# Patient Record
Sex: Female | Born: 1985 | Race: White | Hispanic: No | Marital: Single | Smoking: Never smoker
Health system: Southern US, Community
[De-identification: ages and names within clinical notes are randomized; demographics above are authoritative.]

## PROBLEM LIST (undated history)

## (undated) DIAGNOSIS — G43909 Migraine, unspecified, not intractable, without status migrainosus: Secondary | ICD-10-CM

## (undated) HISTORY — PX: CHOLECYSTECTOMY: SHX55

## (undated) HISTORY — PX: LAPAROSCOPIC OOPHERECTOMY: SHX6507

## (undated) HISTORY — PX: ABDOMINAL SURGERY: SHX537

---
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
## 2007-05-17 ENCOUNTER — Emergency Department: Payer: Self-pay | Admitting: Internal Medicine

## 2007-09-19 ENCOUNTER — Observation Stay: Payer: Self-pay | Admitting: Obstetrics and Gynecology

## 2008-03-03 ENCOUNTER — Emergency Department: Payer: Self-pay | Admitting: Emergency Medicine

## 2009-11-14 ENCOUNTER — Emergency Department: Payer: Self-pay | Admitting: Unknown Physician Specialty

## 2010-09-10 ENCOUNTER — Emergency Department: Payer: Self-pay | Admitting: Emergency Medicine

## 2011-01-25 ENCOUNTER — Emergency Department: Payer: Self-pay | Admitting: Emergency Medicine

## 2012-04-23 LAB — CBC
HCT: 44.9 % (ref 35.0–47.0)
HGB: 15 g/dL (ref 12.0–16.0)
MCH: 29.5 pg (ref 26.0–34.0)
MCHC: 33.5 g/dL (ref 32.0–36.0)
MCV: 88 fL (ref 80–100)
Platelet: 225 10*3/uL (ref 150–440)
RBC: 5.1 10*6/uL (ref 3.80–5.20)
RDW: 13 % (ref 11.5–14.5)
WBC: 17.1 10*3/uL — ABNORMAL HIGH (ref 3.6–11.0)

## 2012-04-23 LAB — COMPREHENSIVE METABOLIC PANEL
Albumin: 4.5 g/dL (ref 3.4–5.0)
Alkaline Phosphatase: 90 U/L (ref 50–136)
Anion Gap: 11 (ref 7–16)
BUN: 11 mg/dL (ref 7–18)
Bilirubin,Total: 1 mg/dL (ref 0.2–1.0)
Calcium, Total: 9.9 mg/dL (ref 8.5–10.1)
Chloride: 102 mmol/L (ref 98–107)
Co2: 23 mmol/L (ref 21–32)
Creatinine: 0.63 mg/dL (ref 0.60–1.30)
EGFR (African American): >60
EGFR (Non-African Amer.): >60
Glucose: 97 mg/dL (ref 65–99)
Osmolality: 271 (ref 275–301)
Potassium: 4.2 mmol/L (ref 3.5–5.1)
SGOT(AST): 14 U/L — ABNORMAL LOW (ref 15–37)
SGPT (ALT): 20 U/L
Sodium: 136 mmol/L (ref 136–145)
Total Protein: 8.8 g/dL — ABNORMAL HIGH (ref 6.4–8.2)

## 2012-04-23 LAB — LIPASE, BLOOD: Lipase: 92 U/L (ref 73–393)

## 2012-04-24 ENCOUNTER — Inpatient Hospital Stay: Payer: Self-pay | Admitting: Obstetrics and Gynecology

## 2012-04-24 LAB — URINALYSIS, COMPLETE
Bilirubin,UR: NEGATIVE
Glucose,UR: NEGATIVE mg/dL (ref 0–75)
Leukocyte Esterase: NEGATIVE
Nitrite: NEGATIVE
Ph: 6 (ref 4.5–8.0)
Protein: 30
RBC,UR: 4 /HPF (ref 0–5)
Specific Gravity: 1.03 (ref 1.003–1.030)
Squamous Epithelial: 2
WBC UR: 5 /HPF (ref 0–5)

## 2012-04-24 LAB — PREGNANCY, URINE: Pregnancy Test, Urine: NEGATIVE m[IU]/mL

## 2012-04-25 LAB — CBC WITH DIFFERENTIAL/PLATELET
Basophil #: 0 10*3/uL (ref 0.0–0.1)
Basophil %: 0.4 %
Eosinophil #: 0.1 10*3/uL (ref 0.0–0.7)
Eosinophil %: 0.8 %
HCT: 31.4 % — ABNORMAL LOW (ref 35.0–47.0)
HGB: 10.4 g/dL — ABNORMAL LOW (ref 12.0–16.0)
Lymphocyte #: 1.9 10*3/uL (ref 1.0–3.6)
Lymphocyte %: 18.9 %
MCH: 29.6 pg (ref 26.0–34.0)
MCHC: 33.3 g/dL (ref 32.0–36.0)
MCV: 89 fL (ref 80–100)
Monocyte #: 0.9 x10 3/mm (ref 0.2–0.9)
Monocyte %: 9.2 %
Neutrophil #: 7 10*3/uL — ABNORMAL HIGH (ref 1.4–6.5)
Neutrophil %: 70.7 %
Platelet: 152 10*3/uL (ref 150–440)
RBC: 3.52 10*6/uL — ABNORMAL LOW (ref 3.80–5.20)
RDW: 13.2 % (ref 11.5–14.5)
WBC: 9.9 10*3/uL (ref 3.6–11.0)

## 2012-04-25 LAB — CREATININE, SERUM
Creatinine: 0.54 mg/dL — ABNORMAL LOW (ref 0.60–1.30)
EGFR (African American): >60
EGFR (Non-African Amer.): >60

## 2012-04-26 LAB — CBC WITH DIFFERENTIAL/PLATELET
Basophil #: 0 10*3/uL (ref 0.0–0.1)
Basophil %: 0.3 %
Eosinophil #: 0.1 10*3/uL (ref 0.0–0.7)
Eosinophil %: 1.8 %
HCT: 31.3 % — ABNORMAL LOW (ref 35.0–47.0)
HGB: 10.4 g/dL — ABNORMAL LOW (ref 12.0–16.0)
Lymphocyte #: 1.9 10*3/uL (ref 1.0–3.6)
Lymphocyte %: 27.6 %
MCH: 29.9 pg (ref 26.0–34.0)
MCHC: 33.2 g/dL (ref 32.0–36.0)
MCV: 90 fL (ref 80–100)
Monocyte #: 0.5 x10 3/mm (ref 0.2–0.9)
Monocyte %: 7.3 %
Neutrophil #: 4.4 10*3/uL (ref 1.4–6.5)
Neutrophil %: 63 %
Platelet: 162 10*3/uL (ref 150–440)
RBC: 3.48 10*6/uL — ABNORMAL LOW (ref 3.80–5.20)
RDW: 13.1 % (ref 11.5–14.5)
WBC: 6.9 10*3/uL (ref 3.6–11.0)

## 2012-04-26 LAB — PATHOLOGY REPORT

## 2012-07-24 ENCOUNTER — Inpatient Hospital Stay: Payer: Self-pay | Admitting: Surgery

## 2012-07-24 LAB — BASIC METABOLIC PANEL
Anion Gap: 12 (ref 7–16)
BUN: 14 mg/dL (ref 7–18)
Calcium, Total: 9.3 mg/dL (ref 8.5–10.1)
Chloride: 104 mmol/L (ref 98–107)
Co2: 19 mmol/L — ABNORMAL LOW (ref 21–32)
Creatinine: 0.59 mg/dL — ABNORMAL LOW (ref 0.60–1.30)
EGFR (African American): >60
EGFR (Non-African Amer.): >60
Glucose: 81 mg/dL (ref 65–99)
Osmolality: 270 (ref 275–301)
Potassium: 3.8 mmol/L (ref 3.5–5.1)
Sodium: 135 mmol/L — ABNORMAL LOW (ref 136–145)

## 2012-07-24 LAB — CBC WITH DIFFERENTIAL/PLATELET
Basophil #: 0 10*3/uL (ref 0.0–0.1)
Basophil %: 0.2 %
Eosinophil #: 0 10*3/uL (ref 0.0–0.7)
Eosinophil %: 0.1 %
HCT: 38.9 % (ref 35.0–47.0)
HGB: 13.3 g/dL (ref 12.0–16.0)
Lymphocyte #: 0.9 10*3/uL — ABNORMAL LOW (ref 1.0–3.6)
Lymphocyte %: 8.7 %
MCH: 29.7 pg (ref 26.0–34.0)
MCHC: 34.3 g/dL (ref 32.0–36.0)
MCV: 87 fL (ref 80–100)
Monocyte #: 1 x10 3/mm — ABNORMAL HIGH (ref 0.2–0.9)
Monocyte %: 9.5 %
Neutrophil #: 8.4 10*3/uL — ABNORMAL HIGH (ref 1.4–6.5)
Neutrophil %: 81.5 %
Platelet: 210 10*3/uL (ref 150–440)
RBC: 4.5 10*6/uL (ref 3.80–5.20)
RDW: 12.8 % (ref 11.5–14.5)
WBC: 10.3 10*3/uL (ref 3.6–11.0)

## 2012-07-24 LAB — URINALYSIS, COMPLETE
Blood: NEGATIVE
Glucose,UR: NEGATIVE mg/dL (ref 0–75)
Leukocyte Esterase: NEGATIVE
Nitrite: POSITIVE
Ph: 6 (ref 4.5–8.0)
Protein: 30
RBC,UR: 1 /HPF (ref 0–5)
Specific Gravity: 1.026 (ref 1.003–1.030)
Squamous Epithelial: 5
WBC UR: NONE SEEN /HPF (ref 0–5)

## 2012-07-24 LAB — HEPATIC FUNCTION PANEL A (ARMC)
Albumin: 4.2 g/dL (ref 3.4–5.0)
Alkaline Phosphatase: 281 U/L — ABNORMAL HIGH (ref 50–136)
Bilirubin, Direct: 3.3 mg/dL — ABNORMAL HIGH (ref 0.00–0.20)
Bilirubin,Total: 4.7 mg/dL — ABNORMAL HIGH (ref 0.2–1.0)
SGOT(AST): 254 U/L — ABNORMAL HIGH (ref 15–37)
SGPT (ALT): 570 U/L — ABNORMAL HIGH (ref 12–78)
Total Protein: 7.5 g/dL (ref 6.4–8.2)

## 2012-07-24 LAB — LIPASE, BLOOD: Lipase: >3000 U/L (ref 73–393)

## 2012-07-25 LAB — CBC WITH DIFFERENTIAL/PLATELET
Basophil #: 0 10*3/uL (ref 0.0–0.1)
Basophil %: 0.2 %
Eosinophil #: 0 10*3/uL (ref 0.0–0.7)
Eosinophil %: 0 %
HCT: 42 % (ref 35.0–47.0)
HGB: 13.7 g/dL (ref 12.0–16.0)
Lymphocyte #: 0.6 10*3/uL — ABNORMAL LOW (ref 1.0–3.6)
Lymphocyte %: 5.3 %
MCH: 28.9 pg (ref 26.0–34.0)
MCHC: 32.5 g/dL (ref 32.0–36.0)
MCV: 89 fL (ref 80–100)
Monocyte #: 0.5 x10 3/mm (ref 0.2–0.9)
Monocyte %: 4.5 %
Neutrophil #: 10 10*3/uL — ABNORMAL HIGH (ref 1.4–6.5)
Neutrophil %: 90 %
Platelet: 204 10*3/uL (ref 150–440)
RBC: 4.73 10*6/uL (ref 3.80–5.20)
RDW: 13.2 % (ref 11.5–14.5)
WBC: 11.1 10*3/uL — ABNORMAL HIGH (ref 3.6–11.0)

## 2012-07-25 LAB — COMPREHENSIVE METABOLIC PANEL
Albumin: 4 g/dL (ref 3.4–5.0)
Alkaline Phosphatase: 283 U/L — ABNORMAL HIGH (ref 50–136)
Anion Gap: 11 (ref 7–16)
BUN: 7 mg/dL (ref 7–18)
Bilirubin,Total: 2.8 mg/dL — ABNORMAL HIGH (ref 0.2–1.0)
Calcium, Total: 9.1 mg/dL (ref 8.5–10.1)
Chloride: 105 mmol/L (ref 98–107)
Co2: 18 mmol/L — ABNORMAL LOW (ref 21–32)
Creatinine: 0.54 mg/dL — ABNORMAL LOW (ref 0.60–1.30)
EGFR (African American): >60
EGFR (Non-African Amer.): >60
Glucose: 69 mg/dL (ref 65–99)
Osmolality: 265 (ref 275–301)
Potassium: 4.3 mmol/L (ref 3.5–5.1)
SGOT(AST): 145 U/L — ABNORMAL HIGH (ref 15–37)
SGPT (ALT): 457 U/L — ABNORMAL HIGH (ref 12–78)
Sodium: 134 mmol/L — ABNORMAL LOW (ref 136–145)
Total Protein: 8 g/dL (ref 6.4–8.2)

## 2012-07-25 LAB — LIPASE, BLOOD: Lipase: 2102 U/L — ABNORMAL HIGH (ref 73–393)

## 2012-07-26 LAB — CBC WITH DIFFERENTIAL/PLATELET
Basophil #: 0 10*3/uL (ref 0.0–0.1)
Basophil %: 0.3 %
Eosinophil #: 0 10*3/uL (ref 0.0–0.7)
Eosinophil %: 0.2 %
HCT: 39.6 % (ref 35.0–47.0)
HGB: 13.5 g/dL (ref 12.0–16.0)
Lymphocyte #: 1 10*3/uL (ref 1.0–3.6)
Lymphocyte %: 13.6 %
MCH: 29.7 pg (ref 26.0–34.0)
MCHC: 34.1 g/dL (ref 32.0–36.0)
MCV: 87 fL (ref 80–100)
Monocyte #: 0.7 x10 3/mm (ref 0.2–0.9)
Monocyte %: 9.7 %
Neutrophil #: 5.3 10*3/uL (ref 1.4–6.5)
Neutrophil %: 76.2 %
Platelet: 199 10*3/uL (ref 150–440)
RBC: 4.55 10*6/uL (ref 3.80–5.20)
RDW: 13.2 % (ref 11.5–14.5)
WBC: 7 10*3/uL (ref 3.6–11.0)

## 2012-07-26 LAB — LIPASE, BLOOD: Lipase: 159 U/L (ref 73–393)

## 2012-07-26 LAB — COMPREHENSIVE METABOLIC PANEL
Albumin: 3.8 g/dL (ref 3.4–5.0)
Alkaline Phosphatase: 258 U/L — ABNORMAL HIGH (ref 50–136)
Anion Gap: 11 (ref 7–16)
BUN: 7 mg/dL (ref 7–18)
Bilirubin,Total: 1.9 mg/dL — ABNORMAL HIGH (ref 0.2–1.0)
Calcium, Total: 9.2 mg/dL (ref 8.5–10.1)
Chloride: 107 mmol/L (ref 98–107)
Co2: 16 mmol/L — ABNORMAL LOW (ref 21–32)
Creatinine: 0.71 mg/dL (ref 0.60–1.30)
EGFR (African American): >60
EGFR (Non-African Amer.): >60
Glucose: 65 mg/dL (ref 65–99)
Osmolality: 264 (ref 275–301)
Potassium: 4.2 mmol/L (ref 3.5–5.1)
SGOT(AST): 80 U/L — ABNORMAL HIGH (ref 15–37)
SGPT (ALT): 334 U/L — ABNORMAL HIGH (ref 12–78)
Sodium: 134 mmol/L — ABNORMAL LOW (ref 136–145)
Total Protein: 7.9 g/dL (ref 6.4–8.2)

## 2012-07-27 LAB — CBC WITH DIFFERENTIAL/PLATELET
Basophil #: 0 10*3/uL (ref 0.0–0.1)
Basophil %: 0.4 %
Eosinophil #: 0 10*3/uL (ref 0.0–0.7)
Eosinophil %: 0.8 %
HCT: 37.7 % (ref 35.0–47.0)
HGB: 12.8 g/dL (ref 12.0–16.0)
Lymphocyte #: 1.9 10*3/uL (ref 1.0–3.6)
Lymphocyte %: 35.8 %
MCH: 29.5 pg (ref 26.0–34.0)
MCHC: 33.9 g/dL (ref 32.0–36.0)
MCV: 87 fL (ref 80–100)
Monocyte #: 0.7 x10 3/mm (ref 0.2–0.9)
Monocyte %: 14.1 %
Neutrophil #: 2.6 10*3/uL (ref 1.4–6.5)
Neutrophil %: 48.9 %
Platelet: 201 10*3/uL (ref 150–440)
RBC: 4.33 10*6/uL (ref 3.80–5.20)
RDW: 13.2 % (ref 11.5–14.5)
WBC: 5.3 10*3/uL (ref 3.6–11.0)

## 2012-07-27 LAB — COMPREHENSIVE METABOLIC PANEL
Albumin: 3.4 g/dL (ref 3.4–5.0)
Alkaline Phosphatase: 227 U/L — ABNORMAL HIGH (ref 50–136)
Anion Gap: 10 (ref 7–16)
BUN: 5 mg/dL — ABNORMAL LOW (ref 7–18)
Bilirubin,Total: 1.6 mg/dL — ABNORMAL HIGH (ref 0.2–1.0)
Calcium, Total: 9 mg/dL (ref 8.5–10.1)
Chloride: 107 mmol/L (ref 98–107)
Co2: 22 mmol/L (ref 21–32)
Creatinine: 0.78 mg/dL (ref 0.60–1.30)
EGFR (African American): >60
EGFR (Non-African Amer.): >60
Glucose: 80 mg/dL (ref 65–99)
Osmolality: 274 (ref 275–301)
Potassium: 3.8 mmol/L (ref 3.5–5.1)
SGOT(AST): 74 U/L — ABNORMAL HIGH (ref 15–37)
SGPT (ALT): 254 U/L — ABNORMAL HIGH (ref 12–78)
Sodium: 139 mmol/L (ref 136–145)
Total Protein: 7.3 g/dL (ref 6.4–8.2)

## 2012-07-27 LAB — LIPASE, BLOOD: Lipase: 202 U/L (ref 73–393)

## 2012-07-28 LAB — COMPREHENSIVE METABOLIC PANEL
Albumin: 3.2 g/dL — ABNORMAL LOW (ref 3.4–5.0)
Alkaline Phosphatase: 189 U/L — ABNORMAL HIGH (ref 50–136)
Anion Gap: 9 (ref 7–16)
BUN: 3 mg/dL — ABNORMAL LOW (ref 7–18)
Bilirubin,Total: 1.2 mg/dL — ABNORMAL HIGH (ref 0.2–1.0)
Calcium, Total: 8.7 mg/dL (ref 8.5–10.1)
Chloride: 104 mmol/L (ref 98–107)
Co2: 26 mmol/L (ref 21–32)
Creatinine: 0.63 mg/dL (ref 0.60–1.30)
EGFR (African American): >60
EGFR (Non-African Amer.): >60
Glucose: 139 mg/dL — ABNORMAL HIGH (ref 65–99)
Osmolality: 276 (ref 275–301)
Potassium: 3.3 mmol/L — ABNORMAL LOW (ref 3.5–5.1)
SGOT(AST): 67 U/L — ABNORMAL HIGH (ref 15–37)
SGPT (ALT): 203 U/L — ABNORMAL HIGH (ref 12–78)
Sodium: 139 mmol/L (ref 136–145)
Total Protein: 6.8 g/dL (ref 6.4–8.2)

## 2012-07-28 LAB — CBC WITH DIFFERENTIAL/PLATELET
Basophil #: 0 10*3/uL (ref 0.0–0.1)
Basophil %: 0.3 %
Eosinophil #: 0 10*3/uL (ref 0.0–0.7)
Eosinophil %: 0.2 %
HCT: 34.8 % — ABNORMAL LOW (ref 35.0–47.0)
HGB: 12.1 g/dL (ref 12.0–16.0)
Lymphocyte #: 1.6 10*3/uL (ref 1.0–3.6)
Lymphocyte %: 18.7 %
MCH: 30 pg (ref 26.0–34.0)
MCHC: 34.8 g/dL (ref 32.0–36.0)
MCV: 86 fL (ref 80–100)
Monocyte #: 1 x10 3/mm — ABNORMAL HIGH (ref 0.2–0.9)
Monocyte %: 11.3 %
Neutrophil #: 5.9 10*3/uL (ref 1.4–6.5)
Neutrophil %: 69.5 %
Platelet: 187 10*3/uL (ref 150–440)
RBC: 4.05 10*6/uL (ref 3.80–5.20)
RDW: 13.5 % (ref 11.5–14.5)
WBC: 8.5 10*3/uL (ref 3.6–11.0)

## 2012-07-28 LAB — LIPASE, BLOOD: Lipase: 133 U/L (ref 73–393)

## 2012-07-31 LAB — PATHOLOGY REPORT

## 2012-11-19 ENCOUNTER — Emergency Department: Payer: Self-pay | Admitting: Emergency Medicine

## 2012-11-19 LAB — CBC
HCT: 41.7 % (ref 35.0–47.0)
HGB: 14 g/dL (ref 12.0–16.0)
MCH: 28.9 pg (ref 26.0–34.0)
MCHC: 33.5 g/dL (ref 32.0–36.0)
MCV: 86 fL (ref 80–100)
Platelet: 201 10*3/uL (ref 150–440)
RBC: 4.83 10*6/uL (ref 3.80–5.20)
RDW: 12.8 % (ref 11.5–14.5)
WBC: 8.4 10*3/uL (ref 3.6–11.0)

## 2012-11-19 LAB — COMPREHENSIVE METABOLIC PANEL
Albumin: 4.5 g/dL (ref 3.4–5.0)
Alkaline Phosphatase: 119 U/L (ref 50–136)
Anion Gap: 6 — ABNORMAL LOW (ref 7–16)
BUN: 8 mg/dL (ref 7–18)
Bilirubin,Total: 0.8 mg/dL (ref 0.2–1.0)
Calcium, Total: 9.4 mg/dL (ref 8.5–10.1)
Chloride: 105 mmol/L (ref 98–107)
Co2: 26 mmol/L (ref 21–32)
Creatinine: 0.76 mg/dL (ref 0.60–1.30)
EGFR (African American): >60
EGFR (Non-African Amer.): >60
Glucose: 97 mg/dL (ref 65–99)
Osmolality: 272 (ref 275–301)
Potassium: 3.6 mmol/L (ref 3.5–5.1)
SGOT(AST): 20 U/L (ref 15–37)
SGPT (ALT): 16 U/L (ref 12–78)
Sodium: 137 mmol/L (ref 136–145)
Total Protein: 8.6 g/dL — ABNORMAL HIGH (ref 6.4–8.2)

## 2012-11-19 LAB — URINALYSIS, COMPLETE
Bilirubin,UR: NEGATIVE
Blood: NEGATIVE
Glucose,UR: NEGATIVE mg/dL (ref 0–75)
Nitrite: NEGATIVE
Ph: 5 (ref 4.5–8.0)
Protein: NEGATIVE
RBC,UR: 4 /HPF (ref 0–5)
Specific Gravity: 1.025 (ref 1.003–1.030)
Squamous Epithelial: 16
WBC UR: 11 /HPF (ref 0–5)

## 2012-11-19 LAB — LIPASE, BLOOD: Lipase: 79 U/L (ref 73–393)

## 2012-11-19 LAB — PREGNANCY, URINE: Pregnancy Test, Urine: NEGATIVE m[IU]/mL

## 2013-07-17 ENCOUNTER — Emergency Department: Payer: Self-pay | Admitting: Emergency Medicine

## 2013-08-08 ENCOUNTER — Emergency Department: Payer: Self-pay | Admitting: Emergency Medicine

## 2013-08-08 LAB — COMPREHENSIVE METABOLIC PANEL
Albumin: 4 g/dL (ref 3.4–5.0)
Alkaline Phosphatase: 93 U/L (ref 50–136)
Anion Gap: 11 (ref 7–16)
BUN: 5 mg/dL — ABNORMAL LOW (ref 7–18)
Bilirubin,Total: 0.7 mg/dL (ref 0.2–1.0)
Calcium, Total: 9.4 mg/dL (ref 8.5–10.1)
Chloride: 103 mmol/L (ref 98–107)
Co2: 23 mmol/L (ref 21–32)
Creatinine: 0.78 mg/dL (ref 0.60–1.30)
EGFR (African American): >60
EGFR (Non-African Amer.): >60
Glucose: 105 mg/dL — ABNORMAL HIGH (ref 65–99)
Osmolality: 271 (ref 275–301)
Potassium: 3.6 mmol/L (ref 3.5–5.1)
SGOT(AST): 15 U/L (ref 15–37)
SGPT (ALT): 15 U/L (ref 12–78)
Sodium: 137 mmol/L (ref 136–145)
Total Protein: 7.5 g/dL (ref 6.4–8.2)

## 2013-08-08 LAB — URINALYSIS, COMPLETE
Bilirubin,UR: NEGATIVE
Blood: NEGATIVE
Glucose,UR: NEGATIVE mg/dL (ref 0–75)
Ketone: NEGATIVE
Leukocyte Esterase: NEGATIVE
Nitrite: POSITIVE
Ph: 6 (ref 4.5–8.0)
Protein: NEGATIVE
RBC,UR: 1 /HPF (ref 0–5)
Specific Gravity: 1.017 (ref 1.003–1.030)
Squamous Epithelial: 11
WBC UR: 2 /HPF (ref 0–5)

## 2013-08-08 LAB — CBC
HCT: 40.3 % (ref 35.0–47.0)
HGB: 13.8 g/dL (ref 12.0–16.0)
MCH: 29.2 pg (ref 26.0–34.0)
MCHC: 34.3 g/dL (ref 32.0–36.0)
MCV: 85 fL (ref 80–100)
Platelet: 235 10*3/uL (ref 150–440)
RBC: 4.73 10*6/uL (ref 3.80–5.20)
RDW: 12.9 % (ref 11.5–14.5)
WBC: 7.6 10*3/uL (ref 3.6–11.0)

## 2013-08-08 LAB — HCG, QUANTITATIVE, PREGNANCY: Beta Hcg, Quant.: 10389 m[IU]/mL — ABNORMAL HIGH

## 2013-08-10 LAB — URINE CULTURE

## 2013-12-08 ENCOUNTER — Ambulatory Visit: Payer: Self-pay | Admitting: Physician Assistant

## 2013-12-08 LAB — URINALYSIS, COMPLETE
Bilirubin,UR: NEGATIVE
Blood: NEGATIVE
Glucose,UR: NEGATIVE mg/dL (ref 0–75)
Nitrite: NEGATIVE
Ph: 7 (ref 4.5–8.0)
Protein: 30
Specific Gravity: 1.02 (ref 1.003–1.030)

## 2013-12-08 LAB — RAPID STREP-A WITH REFLX: Micro Text Report: NEGATIVE

## 2013-12-11 LAB — BETA STREP CULTURE(ARMC)

## 2015-03-09 NOTE — Consult Note (Signed)
PATIENT NAME:  Jeanne Pena, Jahara R MR#:  [id number] DATE OF BIRTH:  [birth year]  DATE OF CONSULTATION:  07/25/2012  REFERRING PHYSICIAN:   CONSULTING PHYSICIAN:  Rodman Keyawn S. Jerrad Mendibles, NP/Paul Oh, MD PRIMARY CARE PHYSICIAN: None. ATTENDING: Marshia Lyandy Ely, MD  REASON FOR CONSULTATION: Acute pancreatitis.   HISTORY OF PRESENT ILLNESS: Jeanne Pena is a 29 year old Caucasian female who presented to [facility] emergency room with a 2-day history of significant abdominal pain. Most of the history was obtained from her mother as patient has been medicated with pain medicine. Monday of this week mother states that her daughter was to meet her at a cookout. She was late.  She spoke to her daughter. Her daughter stated she had an excess amount of indigestion with pain radiating through to her back. She needed to lie down for 45 minutes, was able to continue to her destination. After she got there she ate very little, vomited 5 to 6 times that day. Tuesday she felt better, and then symptoms reoccurred with pain midchest radiating through to her back between her shoulder blades. Later that evening she started vomiting again. She presented to the emergency room for the concern of reoccurrence of these episodes. Significant other is present who states that he has had these episodes but on a much milder scale, average once every couple of months. She is status post laparotomy for torsion of ovary left side June 2013. She has lost approximately 3 to 4 pounds since that time. Has remained afebrile, no reflux, but significant again for indigestion. She had remained very nauseated until presenting to the emergency room. No rectal bleeding. No change in bowel habits. No diarrhea. No melena. Does take Advil or Aleve on a regular basis for headache. No new supplements, especially over the counter. No new medication therapy by prescription.   HOME MEDICATIONS:  Advil or Aleve as directed as needed. Sleep aid over the counter as directed as  needed. [employer] brand sinus pills.   PAST MEDICAL HISTORY: Migraine headaches.   PAST SURGICAL HISTORY: C-section. Laparotomy for torsion of ovary June 2013.   FAMILY HISTORY: Grandmother maternal, cervical cancer diagnosed her late 5050s. No family history of colon cancer, pancreatic concerns.   SOCIAL HISTORY: No tobacco use or alcohol. No recreational drug use. 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 Single, 1 child.   ALLERGIES: None.   REVIEW OF SYSTEMS: All 10 systems reviewed and checked through patient as well as her mother and is unremarkable other than what is stated above.   PHYSICAL EXAMINATION:  VITAL SIGNS: Temperature is 97.8, pulse is 69, respirations are 18, blood pressure 95/55 with a pulse oximetry of 97% on room air.   GENERAL: Well developed, well nourished, 29 year old Caucasian female, resting very comfortably, just recently had received pain medication. Family members at bedside as previously stated.     HEENT: Normocephalic, atraumatic. Pupils equal, reactive to light. Conjunctiva clear. Sclerae anicteric.   NECK: Supple. Trachea midline. No lymphadenopathy or thyromegaly.   PULMONARY: Symmetric rise and fall of chest. Clear to auscultation throughout.   CARDIOVASCULAR: Regular rhythm, S1, S2. No murmurs, no gallops.   ABDOMEN: Soft, nondistended. Bowel sounds are hypoactive. No bruits. No masses. Nontender.   RECTAL: Deferred.   MUSCULOSKELETAL: No contractures. No clubbing,   EXTREMITIES: No edema.   PSYCH: Alert, oriented, but groggy.   NEUROLOGICAL: No gross neurological deficits.   LABORATORY/DIAGNOSTICS: Chemistry panel on admission: Creatinine was 0.59, sodium 135, CO2 was 19, otherwise within normal limits. Lipase greater than  3000. Comparison today's date, creatinine 0.54, sodium declined to 134, CO2 of 18, and lipase has improved to 2102. Hepatic panel: Total bilirubin on admission was 4.7, direct was 3.3 alkaline phosphatase was 281, AST is 254 with  an ALT of 570. Today total bilirubin is improved to 2.8, alkaline phosphatase is 283, AST is 145, and ALT is 457. CBC was within normal limits on admission.  Specifically, WBC was 10.3 which has risen today to 11.1. Neutrophil number was consistently elevated at a range of 8.4 to 10. 0.  Lymphocyte number was 0.9 to 0.6 and monocyte number was 1.0 and improved to within normal range at 0.5. Urinalysis revealed +1 bilirubin, ketones +2, specific gravity 1.02, protein 30 mg/dL, nitrates are positive, bacteria is +2.   EKG: Normal sinus rhythm. Chest, PA and lateral: No acute disease of the chest. Abdominal ultrasound revealed gallbladder adequately distended, contains multiple mobile stones as well as sludge, positive sonographic Murphy's sign, mild gallbladder wall thickening at 3.4 mm. Common bile duct dilated at 10.1 mm. There was no pericholecystic fluid. The pancreatic head exhibits no suspicious masses, but there is increased echogenicity within the pancreas, may reflect fatty infiltration changes.   IMPRESSION: Admitted for gallstone pancreatitis. Abdominal ultrasound revealed evidence of multiple gallstones, probable acute cholecystitis with positive sonographic Murphy's sign, mild dilatation of common bile duct at 10.1 mm. Elevation in LFTs which are trending downward inclusive of lipase.   PLAN: The patient's presentation will be discussed with Dr. Renae Fickle. Recommendations will follow in addendum form.   These services provided by Owens Shark, MS, APRN, [facility], FNP under collaborative agreement with Lutricia Feil, MD.     ____________________________ Rodman Key, NP dsh:vtd D: 07/25/2012 16:29:59 ET T: 07/26/2012 09:27:00 ET JOB#: [id number]  cc: Rodman Key, NP, <Dictator> Rodman Key MD ELECTRONICALLY SIGNED 07/30/2012 14:35

## 2015-03-09 NOTE — Consult Note (Signed)
Pt seen and examined. See Dawn Harrison's notes. Pt with gallstone pancreatitis. Pancreatitis improving. Prefer to wait few days to allow pancreatitis to resolve before proceeding with ERCP. However, if pancreatitis does not resolve quickly, then ERCP sooner than later. Will follow. Thanks.  Electronic Signatures: Lutricia Feilh, Adiel Erney (MD)  (Signed on 05-Sep-13 21:20)  Authored  Last Updated: 05-Sep-13 21:20 by Lutricia Feilh, Obrian Bulson (MD)

## 2015-03-09 NOTE — Consult Note (Signed)
Brief Consult Note: Diagnosis: Admitted for gallstone pancreatitis.  Abdominal ultrasound revealed evidence of multiple gallstones, probable acute cholecystitis with a positive Murphy's sign and dilatation of CBD.  Elevation of LFT.   Consult note dictated.   Discussed with Attending MD.   Comments: Patient's presentation will be discussed with Dr. Lutricia FeilPaul Oh.  Addendum to follow.  Spoke with Dr. Bluford Kaufmannh and will proceed with ERCP when clinically feasible.  LFT and lipase are trending downward.  Recommend continue monitoring.  NPO status.  Continue to monitor laboratory studies..  Electronic Signatures: Rodman KeyHarrison, Dawn S (NP)  (Signed 05-Sep-13 17:11)  Authored: Brief Consult Note   Last Updated: 05-Sep-13 17:11 by Rodman KeyHarrison, Dawn S (NP)

## 2015-03-09 NOTE — Consult Note (Signed)
Chief Complaint:   Subjective/Chief Complaint Feels much better this AM. Min abd pain. Lipase back to normal.LFT improving but still abnormal.   VITAL SIGNS/ANCILLARY NOTES: **Vital Signs.:   06-Sep-13 05:09   Vital Signs Type Routine   Temperature Temperature (F) 98.1   Celsius 36.7   Temperature Source Oral   Pulse Pulse 68   Respirations Respirations 18   Systolic BP Systolic BP 99   Diastolic BP (mmHg) Diastolic BP (mmHg) 67   Mean BP 77   Pulse Ox % Pulse Ox % 97   Pulse Ox Activity Level  At rest   Oxygen Delivery Room Air/ 21 %   Brief Assessment:   Cardiac Regular    Respiratory clear BS    Gastrointestinal minimal tenderness   Lab Results: Hepatic:  06-Sep-13 04:37    Bilirubin, Total  1.9   Alkaline Phosphatase  258   SGPT (ALT)  334   SGOT (AST)  80   Total Protein, Serum 7.9   Albumin, Serum 3.8  Routine Chem:  06-Sep-13 04:37    Glucose, Serum 65   BUN 7   Creatinine (comp) 0.71   Sodium, Serum  134   Potassium, Serum 4.2   Chloride, Serum 107   CO2, Serum  16   Calcium (Total), Serum 9.2   Osmolality (calc) 264   eGFR (African American) >60   eGFR (Non-African American) >60 (eGFR values <24m/min/1.73 m2 may be an indication of chronic kidney disease (CKD). Calculated eGFR is useful in patients with stable renal function. The eGFR calculation will not be reliable in acutely ill patients when serum creatinine is changing rapidly. It is not useful in  patients on dialysis. The eGFR calculation may not be applicable to patients at the low and high extremes of body sizes, pregnant women, and vegetarians.)   Anion Gap 11   Lipase 159 (Result(s) reported on 26 Jul 2012 at 05:29AM.)  Routine Hem:  06-Sep-13 04:37    WBC (CBC) 7.0   RBC (CBC) 4.55   Hemoglobin (CBC) 13.5   Hematocrit (CBC) 39.6   Platelet Count (CBC) 199   MCV 87   MCH 29.7   MCHC 34.1   RDW 13.2   Neutrophil % 76.2   Lymphocyte % 13.6   Monocyte % 9.7   Eosinophil % 0.2    Basophil % 0.3   Neutrophil # 5.3   Lymphocyte # 1.0   Monocyte # 0.7   Eosinophil # 0.0   Basophil # 0.0 (Result(s) reported on 26 Jul 2012 at 07:09AM.)   Assessment/Plan:  Assessment/Plan:   Assessment Pancreatitis resolving.    Plan Will proceed with ERCP today. Pt aware of risks/complications. Thanks. Keep NPO until then. Hold heparin.   Electronic Signatures: OVerdie Shire(MD)  (Signed 06-Sep-13 08:41)  Authored: Chief Complaint, VITAL SIGNS/ANCILLARY NOTES, Brief Assessment, Lab Results, Assessment/Plan   Last Updated: 06-Sep-13 08:41 by OVerdie Shire(MD)

## 2015-03-09 NOTE — H&P (Signed)
PATIENT NAME:  Jeanne Pena, Jeanne Pena MR#:  [id number] DATE OF BIRTH:  1986/11/20  DATE OF ADMISSION:  07/24/2012  PRIMARY CARE PHYSICIAN: None.   ADMITTING PHYSICIAN: Dr. Michela PitcherEly   CHIEF COMPLAINT: Abdominal pain, nausea, and vomiting.   BRIEF HISTORY: Jeanne Pena is a 29 year old woman seen in the Emergency Room with a two-day history of significant abdominal pain. The pain came on fairly suddenly in the midepigastric substernal area radiating through to her back. The pain was associated with profound nausea and she began to vomit about four to five hours later.  The pain has been colicky in nature but persistent since its onset two days ago. Her vomiting has increased and she has been unable to keep anything on her stomach. She began to have some diarrhea earlier today. She denies any fever. She had a light chill earlier in the day. She has had previous symptoms although never as severe as this current episode. She has not had any work-up for this problem prior to her Emergency Room evaluation. She does not have a primary care physician.   She had recently in June 2013 underwent laparotomy for torsion of an ovary with oophorectomy. She has had a previous C-section as her only other surgical procedure. She denies any history of hepatitis, yellow jaundice, pancreatitis, peptic ulcer disease, previous diagnosis of gallbladder disease, or diverticulitis. She has no other major medical problems. She has no cardiac disease, hypertension, or diabetes. She does have history of migraine headache. Her only medication has been Imitrex for that. She has no medical allergies.   SOCIAL HISTORY: She does not smoke cigarettes or drink any significant amount of alcohol. She denies illicit drug use. She works as a Environmental managerCNA and waitress. She has a single child, the product of her C-section.   FAMILY HISTORY: Noncontributory.   REVIEW OF SYSTEMS: Otherwise unremarkable. 10-point review of systems was carried out and other  than the symptoms noted above is negative.    PHYSICAL EXAMINATION:  GENERAL: She is mildly sedated but cooperative, in obvious pain.   VITALS: Blood pressure 128/72. Heart rate 80 and regular, oxygen saturation 99% on room air.   HEENT: Examination unremarkable. No scleral icterus and no pupillary abnormalities. She has no facial deformities.   NECK: Supple without adenopathy. Trachea is midline.   CHEST: Clear with no adventitious sounds. She has normal pulmonary excursion.   CARDIAC: No murmurs or gallops to my ear. She seems to be in normal sinus rhythm.   ABDOMEN: Soft, nondistended, with some mild epigastric tenderness but no point tenderness. No rebound. No guarding. No masses. No hernias noted. She has a well-healed Pfannenstiel lower abdominal incision.   EXTREMITIES: Full range of motion, no deformities, and good distal pulses.   PSYCHIATRIC: Some mild sedation but normal orientation and otherwise normal affect.   IMPRESSION: Work-up in the Emergency Room demonstrated markedly abnormal laboratory values. CBC is unremarkable with no elevation in her white blood cell count. Hemoglobin is 13.3. However, her liver function studies demonstrate a bilirubin of 4.7, alkaline phosphatase 281, SGPT 570, and SGOT 254, and lipase of greater than 3000. Ultrasound was performed in the Emergency Room examination which demonstrated multiple gallstones, probable acute cholecystitis with a positive sonographic Murphy's sign, and a mildly dilated common bile duct at 10.1 mm.   ASSESSMENT AND PLAN: Working diagnosis here would be biliary pancreatitis with possible common bile duct obstruction. She is obviously quite sick. We will admit her to the hospital, place her on IV rehydration,  start antibiotics for her acute cholecystitis, put her on bowel rest and evaluate her progress. We will ask the gastroenterology service to see her.  As long as her pancreas is significantly symptomatic I suspect they  will not want to pursue ERCP unless she does not begin to show any evidence of improvement.     This plan has been discussed with the patient and her family. The possibility of surgical intervention has been outlined. They are in agreement.    ____________________________ Carmie End, MD rle:bjt D: 07/24/2012 20:43:58 ET T: 07/25/2012 07:17:38 ET JOB#: [id number]  cc: Quentin Ore III, MD, <Dictator> Quentin Ore MD ELECTRONICALLY SIGNED 07/25/2012 20:54

## 2015-03-09 NOTE — Consult Note (Signed)
ERCP showed dilated CBD but no stones/sludge remaining in the CBD. Clear liquid diet ordered. GB surgery when available. Dr. Mechele CollinElliott to cover this weekend. Thanks.  Electronic Signatures: Lutricia Feilh, Ferne Ellingwood (MD)  (Signed on 06-Sep-13 14:28)  Authored  Last Updated: 06-Sep-13 14:28 by Lutricia Feilh, Aubre Quincy (MD)

## 2015-03-09 NOTE — Op Note (Signed)
PATIENT NAME:  Jeanne Pena, Jeanne Pena MR#:  [id number] DATE OF BIRTH:  Jan 25, 1986  DATE OF PROCEDURE:  07/26/2012  PREOPERATIVE DIAGNOSIS: Biliary pancreatitis.   POSTOPERATIVE DIAGNOSIS: Biliary pancreatitis.   PROCEDURE: Laparoscopic cholecystectomy with C-arm fluoroscopic cholangiography.   SURGEON: Jie Stickels E. Excell Seltzerooper, MD    ANESTHESIA: General with endotracheal tube.   INDICATIONS: This is a patient with a history of biliary pancreatitis who has had an ERCP yesterday showing no stones in the bile duct.   Preoperatively we discussed rationale for surgery at this point and the options of observation, the risks of bleeding, infection, recurrence of symptoms, failure to resolve her symptoms, retained common bile duct stone, bile duct damage, bile duct leak, conversion to an open procedure, or an ERCP postoperatively for retained stone. This was all reviewed for she and her family. They understood and agreed to proceed.   FINDINGS: C-arm fluoroscopic cholangiography demonstrated good flow in the duodenum without intraluminal filling defects. Proximal ducts were identified and the cystic duct had been cannulated.   Multiple adhesions were noted in the area of the gallbladder as well as in the right colon and pelvis.   DESCRIPTION OF PROCEDURE: The patient was induced to general anesthesia. She was on IV antibiotics. VTE prophylaxis was in place. She was prepped and draped in a sterile fashion. Marcaine was infiltrated in skin and subcutaneous tissues around the periumbilical area. Incision was made. Veress needle was placed. Pneumoperitoneum was obtained. A 5 mm trocar port was placed. The abdominal cavity was explored and under direct vision a 10 mm epigastric port and two lateral 5 mm ports were placed. The gallbladder was placed on tension and adhesions were taken down bluntly without the use of energy. The peritoneum over the infundibulum was incised bluntly. The cystic lymphatics were identified  and doubly clipped and divided and the cystic duct gallbladder junction was well identified, clipped, incised, and through a separate incision an Angiocath cholangiogram catheter was placed. C-arm fluoroscopic cholangiography was performed with the above findings. The cystic duct catheter was removed. The cystic duct was doubly clipped and divided. Then the cystic artery was doubly clipped and divided and the gallbladder was taken from the gallbladder fossa with electrocautery and passed out through the epigastric port site. The port site was replaced. The area was irrigated with copious amounts of normal saline. Hemostasis was adequate. There was no sign of bleeding, bile leak, or bowel injury. The camera was placed in the epigastric site to view back the periumbilical site. Pelvic adhesions in the right lower quadrant, colonic adhesions were identified as well. These were of a longstanding nature. At this point with no sign of bowel injury, bile leak or bleeding, pneumoperitoneum was released. All ports were removed. Fascial edges at the epigastric site were approximated with 0 Vicryl figure-of-eight sutures. 4-0 subcuticular Monocryl was used on all skin edges. Steri-Strips, Mastisol, and sterile dressings were placed.   The patient tolerated the procedure well. There were no complications. She was taken to the recovery room in stable condition to be admitted for continued care.   ____________________________ Adah Salvageichard E. Excell Seltzerooper, MD rec:drc D: 07/27/2012 09:57:00 ET T: 07/27/2012 12:32:46 ET JOB#: [id number]  cc: Adah Salvageichard E. Excell Seltzerooper, MD, <Dictator> Lattie HawICHARD E Georgette Helmer MD ELECTRONICALLY SIGNED 07/28/2012 14:13

## 2015-03-09 NOTE — Consult Note (Signed)
PATIENT NAME:  Jeanne CarinaMITCHELL, Amadi R MR#:  [id number] DATE OF BIRTH:  22-Jan-1986  DATE OF CONSULTATION:  07/25/2012  REFERRING PHYSICIAN:   CONSULTING PHYSICIAN:  Rodman Keyawn S. Aliana Kreischer, NP  ADDENDUM:   The patient's presentation was discussed with Dr. Lutricia FeilPaul Oh. I will proceed with ERCP when clinically feasible. LFT and lipase levels are trending downward. Encouraged by noted mild improvement, recommend continue monitoring. N.p.o. status. Continue with current pain management as ordered.   These services provided by Rodman Keyawn S. Jacquiline Zurcher, NP under collaborative agreement with Lutricia FeilPaul Oh, MD.       ____________________________ Rodman Keyawn S. Aleigha Gilani, NP dsh:vtd D: 07/25/2012 17:11:50 ET T: 07/26/2012 09:42:38 ET JOB#: [id number]  cc: Rodman Keyawn S. Jeannie Mallinger, NP, <Dictator> Rodman KeyAWN S Zyir Gassert MD ELECTRONICALLY SIGNED 07/30/2012 14:35

## 2015-03-09 NOTE — Discharge Summary (Signed)
PATIENT NAME:  Jeanne Pena, Jeanne Pena MR#:  [id number] DATE OF BIRTH:  09-08-1986  DATE OF ADMISSION:  07/24/2012 DATE OF DISCHARGE:  07/28/2012  DISCHARGE DIAGNOSES:  1. Choledocholithiasis.  2. Biliary pancreatitis.   CONSULTANTS: Dr. Bluford Kaufmannh.   PROCEDURES:  1. ERCP.  2. Laparoscopic cholecystectomy with cholangiography.   HISTORY OF PRESENT ILLNESS AND HOSPITAL COURSE: This is a patient who was admitted to the hospital with documented biliary pancreatitis with gallstones and elevated lipase level. Her lipase and LFTs improved considerably. She was taken to the endoscopy suite by Dr. Bluford Kaufmannh who performed an ERCP showing that there were no stones present in the bile duct. Then the next day she went to the Operating Room for laparoscopic cholecystectomy with cholangiography. Again, no stones were identified in the bile duct. She made an uncomplicated postoperative recovery and is discharged in stable condition, tolerating a regular diet on p.o. analgesics. She will follow-up in my office in 10 days. She is instructed concerning dressing care and showering.  ____________________________ Adah Salvageichard E. Excell Seltzerooper, MD rec:rbg D: 07/28/2012 14:42:04 ET T: 07/30/2012 12:20:30 ET JOB#: [id number]  cc: Adah Salvageichard E. Excell Seltzerooper, MD, <Dictator> Lattie HawICHARD E Magdala Brahmbhatt MD ELECTRONICALLY SIGNED 07/31/2012 13:07

## 2015-03-14 NOTE — H&P (Signed)
PATIENT NAME:  Jeanne Pena, Jeanne R MR#:  [id number] DATE OF BIRTH:  1985-12-25  DATE OF ADMISSION:  04/23/2012  <<MISSING TEXT>> (inaudible throughout whole dictation) ____________________________ Prentice DockerMartin A. Cendy Oconnor, MD mad:slb D: 04/24/2012 05:44:59 ET T: 04/24/2012 08:38:03 ET JOB#: [id number]  cc: Daphine DeutscherMartin A. [name], MD, <Dictator>

## 2015-03-14 NOTE — Op Note (Signed)
PATIENT NAME:  Jeanne Pena, [name] MR#:  [id number] DATE OF BIRTH:  Jan 05, 1986  DATE OF PROCEDURE:  04/24/2012  PREOPERATIVE DIAGNOSIS: Acute abdomen with suspected ovarian torsion.   POSTOPERATIVE DIAGNOSIS: Acute abdomen with ovarian torsion and nonviable adnexa.   OPERATIVE PROCEDURES: 1. Operative laparoscopy.  2. Exploratory laparotomy with left salpingo-oophorectomy.   SURGEON: Prentice DockerMartin A. Jeoffrey Eleazer, MD  FIRST ASSISTANT: None.   ANESTHESIA: General endotracheal.   INDICATIONS: The patient is a 29 year old white female, para 1-0-2-1, who presented through the Emergency Room with an acute abdomen. Patient had two days of intermittently worsening colicky pain with development of fever and nausea and vomiting. Work-up in the Emergency Room revealed a left adnexal solid mass with possible cyst and on Doppler studies no flow of blood to the ovary. Suspected torsion was the admitting diagnosis.   FINDINGS AT SURGERY: Once patient was placed under anesthesia exam revealed a palpable abdominal mass on the left side to the level of the umbilicus. It was firm and solid. Bimanual exam again confirmed findings of this mass. On laparoscopy a solid-appearing hemorrhagic ecchymotic 10 to 12 cm adnexal mass was identified. The mass was heavy and attempt at untwisting the torsion was unsuccessful. Exploratory laparotomy then had to be performed to complete the procedure. The remaining right tube and ovary were normal. There were findings of the cul-de-sac peritoneum that were suspicious for endometriosis. There was evidence of burgundy serous fluid within the pelvis consistent with some hemorrhage.   DESCRIPTION OF PROCEDURE: Patient was brought to the Operating Room where she was placed in the supine position. General endotracheal anesthesia was induced without difficulty. She was placed in the low lithotomy position using the bumblebee stirrups. A ChloraPrep and Betadine abdominal, perineal, intravaginal  prep and drape was performed in the standard fashion. A Red Robinson catheter was used to drain 50 mL of urine from the bladder. A Hulka tenaculum was placed onto the cervix. The IUD was in place with several centimeters of string coming out of the os. This was untouched. The laparoscopy was performed in standard manner. A 5 mm port was placed in the subumbilical 5 mm incision under direct visualization. No bowel or vascular injury was encountered. A 5 mm port was placed in the right lower quadrant and an 11 mm port was placed in the left lower quadrant under direct visualization. Attempts were made to untwist the torsed left adnexa. Due to the weight of the adnexal structure and the size of the torsed adnexa, the procedure could not be accomplished. Decision was then made to perform laparotomy. The pneumoperitoneum was released. The incisions were closed with 4-0 Vicryl suture. Preparation was accomplished for minilaparotomy. Foley catheter was placed. Patient was maintained in the low lithotomy position. Pfannenstiel incision was then made into the abdomen with the fascia being incised transversely and extended bilaterally with Mayo scissors. The rectus muscle was dissected off of the fascia through sharp and blunt dissection. The midline raphe was separated and the peritoneum was entered. Richardson retractors were used to facilitate exposure. The adnexa was untwisted and the adnexal mass was brought out of the incision. The infundibulopelvic ligament appeared to be thrombosed and there was no improvement in blood flow to the structure following untwisting of the adnexa. Decision was then made to excise the left tube and ovary. Verifying that the ureters were out of harm's way the IP ligament was doubly clamped with curved Heaney clamps and cut. Additional clamps were placed along the mesosalpinx as well. The  structure was excised from the operative field. Suture ligatures of 0 Vicryl were placed along the  pedicles with good hemostasis. Once satisfied with hemostasis and following copious irrigation of the pelvis and survey of the pelvis, the procedure was then terminated with all instruments being removed from the abdominopelvic cavity. The lap counts were verified as correct. The incision was closed in layers with 0 Maxon being used on the fascia in a simple running manner. The skin was closed with staples. A pressure dressing was applied. Patient was then awakened, extubated, and taken to recovery in satisfactory condition. Estimated blood loss was approximately 50 mL intraoperatively. There was a hemoperitoneum present of approximately 100 mL. All instruments, needle and sponge counts were verified as correct. Patient did not receive antibiotic prophylaxis.    ____________________________ Prentice Docker Isabelle Matt, MD mad:cms D: 04/25/2012 08:36:58 ET T: 04/25/2012 09:43:25 ET JOB#: [id number]  cc: Daphine Deutscher A. Goddess Gebbia, MD, <Dictator> Prentice Docker Joleene Burnham MD ELECTRONICALLY SIGNED 04/30/2012 13:18

## 2015-03-14 NOTE — H&P (Signed)
PATIENT NAME:  Jeanne Pena, Jeanne Pena MR#:  [id number] DATE OF BIRTH:  01-18-1986  DATE OF ADMISSION:  04/23/2012  DIAGNOSIS: Suspected ovarian torsion.   HISTORY OF PRESENT ILLNESS: Jeanne Pena is a 29 year old female para 1-0-2-1, last menstrual period uncertain, who presented to the ER for evaluation of suspected ovarian torsion. The patient had developed fever and colicky abdominal pelvic pain. Subsequent evaluation demonstrated a 5 to 6 cm ovarian mass with no blood flow and consistent with an ovarian torsion. The patient has been treated for the same in the past. The patient is now to proceed to surgery for intervention of this issue.   PAST MEDICAL HISTORY: Ovarian cyst.   PAST SURGICAL HISTORY:  1. Cesarean section x1.  2. Laparotomy with management of ovarian cyst, torsed.  3. Dilation and curettage x1.   PAST OB HISTORY: Para 1-0-2-1, Cesarean section x1, SAB x2 with one requiring dilation and curettage.   FAMILY HISTORY: Negative.   SOCIAL HISTORY: The patient does drink alcohol socially. She reports no tobacco use. She denies drug use.   PHYSICAL EXAMINATION:   GENERAL: The patient is a pleasant female with mild discomfort.   ABDOMEN: Abdomen is tender with mild guarding.   PELVIC: Deferred.   VITAL SIGNS: Please see ER sheet.   IMPRESSION: Suspected ovarian cyst, torsed, based on ultrasound findings.   PLAN: Operative laparoscopy with management of ovarian torsion, possible salpingo-oophorectomy.   CONSENT NOTE: Jeanne Pena is to undergo laparoscopic management of ovarian torsion. She is understanding of the planned procedure and is aware of and is accepting of all surgical risks, which include but are not limited to, bleeding, infection, pelvic organ injury with need for repair, possible loss of the involved ovary. The patient is accepting of all risks. She consents to surgery.   ____________________________ Prentice DockerMartin A. Brenisha Tsui, MD mad:drc D: 04/24/2012  05:20:09 ET T: 04/24/2012 08:24:50 ET JOB#: [id number]  cc: Daphine DeutscherMartin A. Donnette Macmullen, MD, <Dictator> Prentice DockerMARTIN A Prabhnoor Ellenberger MD ELECTRONICALLY SIGNED 04/30/2012 13:18

## 2016-01-11 ENCOUNTER — Emergency Department
Admission: EM | Admit: 2016-01-11 | Discharge: 2016-01-11 | Disposition: A | Discharge status: Home / Self Care | Payer: BLUE CROSS/BLUE SHIELD | Attending: Emergency Medicine | Admitting: Emergency Medicine

## 2016-01-11 ENCOUNTER — Encounter: Payer: Self-pay | Admitting: Emergency Medicine

## 2016-01-11 DIAGNOSIS — B349 Viral infection, unspecified: Secondary | ICD-10-CM | POA: Insufficient documentation

## 2016-01-11 DIAGNOSIS — R509 Fever, unspecified: Secondary | ICD-10-CM | POA: Diagnosis present

## 2016-01-11 LAB — RAPID INFLUENZA A&B ANTIGENS
Influenza A (ARMC): NOT DETECTED
Influenza B (ARMC): NOT DETECTED

## 2016-01-11 LAB — POCT RAPID STREP A: Streptococcus, Group A Screen (Direct): NEGATIVE

## 2016-01-11 MED ORDER — FLUTICASONE PROPIONATE 50 MCG/ACT NA SUSP: 2.0000 | Freq: Every day | NASAL | Status: DC

## 2016-01-11 MED ORDER — PSEUDOEPH-BROMPHEN-DM 30-2-10 MG/5ML PO SYRP: 10.0000 mL | ORAL_SOLUTION | Freq: Four times a day (QID) | ORAL | Status: DC | PRN

## 2016-01-11 NOTE — ED Notes (Signed)
See triage.  Sore throat,fever and cough for the past 2-3 days

## 2016-01-11 NOTE — ED Notes (Signed)
Strep swab and flu swab completed by tech, Elita Quick

## 2016-01-11 NOTE — ED Notes (Signed)
Pt to ed with c/o sore throat, fever, body aches, cough x 2 days.

## 2016-01-11 NOTE — Discharge Instructions (Signed)
Viral Infections °A viral infection can be caused by different types of viruses. Most viral infections are not serious and resolve on their own. However, some infections may cause severe symptoms and may lead to further complications. °SYMPTOMS °Viruses can frequently cause: °· Minor sore throat. °· Aches and pains. °· Headaches. °· Runny nose. °· Different types of rashes. °· Watery eyes. °· Tiredness. °· Cough. °· Loss of appetite. °· Gastrointestinal infections, resulting in nausea, vomiting, and diarrhea. °These symptoms do not respond to antibiotics because the infection is not caused by bacteria. However, you might catch a bacterial infection following the viral infection. This is sometimes called a "superinfection." Symptoms of such a bacterial infection may include: °· Worsening sore throat with pus and difficulty swallowing. °· Swollen neck glands. °· Chills and a high or persistent fever. °· Severe headache. °· Tenderness over the sinuses. °· Persistent overall ill feeling (malaise), muscle aches, and tiredness (fatigue). °· Persistent cough. °· Yellow, green, or brown mucus production with coughing. °HOME CARE INSTRUCTIONS  °· Only take over-the-counter or prescription medicines for pain, discomfort, diarrhea, or fever as directed by your caregiver. °· Drink enough water and fluids to keep your urine clear or pale yellow. Sports drinks can provide valuable electrolytes, sugars, and hydration. °· Get plenty of rest and maintain proper nutrition. Soups and broths with crackers or rice are fine. °SEEK IMMEDIATE MEDICAL CARE IF:  °· You have severe headaches, shortness of breath, chest pain, neck pain, or an unusual rash. °· You have uncontrolled vomiting, diarrhea, or you are unable to keep down fluids. °· You or your child has an oral temperature above 102° F (38.9° C), not controlled by medicine. °· Your baby is older than 3 months with a rectal temperature of 102° F (38.9° C) or higher. °· Your baby is 3  months old or younger with a rectal temperature of 100.4° F (38° C) or higher. °MAKE SURE YOU:  °· Understand these instructions. °· Will watch your condition. °· Will get help right away if you are not doing well or get worse. °  °This information is not intended to replace advice given to you by your health care provider. Make sure you discuss any questions you have with your health care provider. °  °Document Released: 08/16/2005 Document Revised: 01/29/2012 Document Reviewed: 04/14/2015 °Elsevier Interactive Patient Education ©2016 [employer] ° °

## 2016-01-11 NOTE — ED Provider Notes (Signed)
[facility] Emergency Department Provider Note  ____________________________________________  Time seen: Approximately 3:24 PM  I have reviewed the triage vital signs and the nursing notes.   HISTORY  Chief Complaint Sore Throat and Fever    HPI Jeanne Pena is a 30 y.o. female , NAD, presents emergency department with history of sore throat, fever, headache, body aches, runny nose for 2-3 days. Aches and subjective fever began in the last 24 hours. Has been fatigued during this illness. Took a Zyrtec this morning as she thought was related to allergies but has had no relief. No known sick contacts. Denies chest pain, chest congestion, shortness of breath, wheezing, abdominal pain, nausea, vomiting, changes in urinary or bowel habits. Does note some generalized lower extremity aching and lower back pain. Denies any injuries or traumas.   History reviewed. No pertinent past medical history.  There are no active problems to display for this patient.   History reviewed. No pertinent past surgical history.  Current Outpatient Rx  Name  Route  Sig  Dispense  Refill  . brompheniramine-pseudoephedrine-DM 30-2-10 MG/5ML syrup   Oral   Take 10 mLs by mouth 4 (four) times daily as needed.   200 mL   0   . fluticasone (FLONASE) 50 MCG/ACT nasal spray   Each Nare   Place 2 sprays into both nostrils daily.   16 g   0     Allergies Review of patient's allergies indicates no known allergies.  History reviewed. No pertinent family history.  Social History Social History  Substance Use Topics  . Smoking status: Never Smoker   . Smokeless tobacco: None  . Alcohol Use: No     Review of Systems  Constitutional: Positive subjective fever.  Positive fatigue, chills Eyes: No visual changes. No discharge ENT: Positive sore throat, nasal congestion, runny nose. No ear pain. Cardiovascular: No chest pain. Respiratory: Positive cough. No shortness of  breath. No wheezing.  Gastrointestinal: No abdominal pain.  No nausea, vomiting.   Genitourinary: Negative for dysuria, increased frequency. Musculoskeletal: Positive for general myalgias.  Skin: Negative for rash. Neurological: Positive for headaches, but no focal weakness or numbness. 10-point ROS otherwise negative.  ____________________________________________   PHYSICAL EXAM:  VITAL SIGNS: ED Triage Vitals  Enc Vitals Group     BP 01/11/16 1453 113/70 mmHg     Pulse Rate 01/11/16 1453 96     Resp 01/11/16 1453 20     Temp 01/11/16 1453 99 F (37.2 C)     Temp Source 01/11/16 1453 Oral     SpO2 01/11/16 1453 100 %     Weight 01/11/16 1453 173 lb (78.472 kg)     Height 01/11/16 1453  (1.6 m)     Head Cir --      Peak Flow --      Pain Score 01/11/16 1454 3     Pain Loc --      Pain Edu? --      Excl. in GC? --     Constitutional: Alert and oriented. Well appearing and in no acute distress. Eyes: Conjunctivae are normal. PERRL. EOMI without pain.  Head: Atraumatic. ENT:      Ears: TMs visualized without erythema, effusion, bulging, perforation. Bilateral external ear canals without erythema, swelling, discharge.      Nose: No congestion but trace clear rhinnorhea.      Mouth/Throat: Mucous membranes are moist. Pharynx with trace injection but no swelling or exudates. Neck: No stridor.  No cervical spine tenderness to palpation. Supple with full range of motion. Hematological/Lymphatic/Immunilogical: No cervical lymphadenopathy. Cardiovascular: Normal rate, regular rhythm. Normal S1 and S2.  Good peripheral circulation. Respiratory: Normal respiratory effort without tachypnea or retractions. Lungs CTAB. Neurologic:  Normal speech and language. No gross focal neurologic deficits are appreciated.  Skin:  Skin is warm, dry and intact. No rash noted. Psychiatric: Mood and affect are normal. Speech and behavior are normal. Patient exhibits appropriate insight and  judgement.   ____________________________________________   LABS (all labs ordered are listed, but only abnormal results are displayed)  Labs Reviewed  RAPID INFLUENZA A&B ANTIGENS ([facility] ONLY)  POCT RAPID STREP A   ____________________________________________  EKG  None ____________________________________________  RADIOLOGY  none ____________________________________________    PROCEDURES  Procedure(s) performed: None   Medications - No data to display   ____________________________________________   INITIAL IMPRESSION / ASSESSMENT AND PLAN / ED COURSE  Pertinent lab results that were available during my care of the patient were reviewed by me and considered in my medical decision making (see chart for details).  Patient's diagnosis is consistent with viral infection. Patient will be discharged home with prescriptions for Bromfed-DM and Flonase to use as prescribed. Patient is to follow up with [facility] if symptoms persist past this treatment course. Patient is given ED precautions to return to the ED for any worsening or new symptoms.    ____________________________________________  FINAL CLINICAL IMPRESSION(S) / ED DIAGNOSES  Final diagnoses:  Viral infection      NEW MEDICATIONS STARTED DURING THIS VISIT:  New Prescriptions   BROMPHENIRAMINE-PSEUDOEPHEDRINE-DM 30-2-10 MG/5ML SYRUP    Take 10 mLs by mouth 4 (four) times daily as needed.   FLUTICASONE (FLONASE) 50 MCG/ACT NASAL SPRAY    Place 2 sprays into both nostrils daily.         Hope Pigeon, PA-C 01/11/16 1636  Rockne Menghini, MD 01/12/16 [phone]

## 2016-06-09 ENCOUNTER — Encounter: Payer: Self-pay | Admitting: *Deleted

## 2016-06-09 ENCOUNTER — Ambulatory Visit
Admission: EM | Admit: 2016-06-09 | Discharge: 2016-06-09 | Disposition: A | Discharge status: Home / Self Care | Payer: Medicaid Other | Attending: Family Medicine | Admitting: Family Medicine

## 2016-06-09 ENCOUNTER — Ambulatory Visit
Admission: RE | Admit: 2016-06-09 | Discharge: 2016-06-09 | Disposition: A | Discharge status: Home / Self Care | Payer: Medicaid Other | Source: Ambulatory Visit | Attending: Family Medicine | Admitting: Family Medicine

## 2016-06-09 DIAGNOSIS — M549 Dorsalgia, unspecified: Secondary | ICD-10-CM | POA: Insufficient documentation

## 2016-06-09 DIAGNOSIS — N9489 Other specified conditions associated with female genital organs and menstrual cycle: Secondary | ICD-10-CM | POA: Insufficient documentation

## 2016-06-09 DIAGNOSIS — A499 Bacterial infection, unspecified: Secondary | ICD-10-CM | POA: Diagnosis not present

## 2016-06-09 DIAGNOSIS — R102 Pelvic and perineal pain: Secondary | ICD-10-CM

## 2016-06-09 DIAGNOSIS — N760 Acute vaginitis: Secondary | ICD-10-CM

## 2016-06-09 DIAGNOSIS — Z90721 Acquired absence of ovaries, unilateral: Secondary | ICD-10-CM | POA: Insufficient documentation

## 2016-06-09 DIAGNOSIS — M545 Low back pain: Secondary | ICD-10-CM | POA: Diagnosis present

## 2016-06-09 DIAGNOSIS — N83291 Other ovarian cyst, right side: Secondary | ICD-10-CM | POA: Diagnosis not present

## 2016-06-09 DIAGNOSIS — B9689 Other specified bacterial agents as the cause of diseases classified elsewhere: Secondary | ICD-10-CM

## 2016-06-09 HISTORY — DX: Migraine, unspecified, not intractable, without status migrainosus: G43.909

## 2016-06-09 LAB — CHLAMYDIA/NGC RT PCR (ARMC ONLY)
Chlamydia Tr: NOT DETECTED
N gonorrhoeae: NOT DETECTED

## 2016-06-09 LAB — BASIC METABOLIC PANEL
Anion gap: 9 (ref 5–15)
BUN: 12 mg/dL (ref 6–20)
CO2: 22 mmol/L (ref 22–32)
Calcium: 9.1 mg/dL (ref 8.9–10.3)
Chloride: 105 mmol/L (ref 101–111)
Creatinine, Ser: 0.73 mg/dL (ref 0.44–1.00)
GFR calc Af Amer: >60 mL/min (ref >60)
GFR calc non Af Amer: >60 mL/min (ref >60)
Glucose, Bld: 97 mg/dL (ref 65–99)
Potassium: 3.9 mmol/L (ref 3.5–5.1)
Sodium: 136 mmol/L (ref 135–145)

## 2016-06-09 LAB — CBC WITH DIFFERENTIAL/PLATELET
Basophils Absolute: 0 10*3/uL (ref 0–0.1)
Basophils Relative: 0 %
Eosinophils Absolute: 0.1 10*3/uL (ref 0–0.7)
Eosinophils Relative: 1 %
HCT: 40.5 % (ref 35.0–47.0)
Hemoglobin: 13.5 g/dL (ref 12.0–16.0)
Lymphocytes Relative: 27 %
Lymphs Abs: 2.4 10*3/uL (ref 1.0–3.6)
MCH: 28.7 pg (ref 26.0–34.0)
MCHC: 33.5 g/dL (ref 32.0–36.0)
MCV: 85.7 fL (ref 80.0–100.0)
Monocytes Absolute: 0.6 10*3/uL (ref 0.2–0.9)
Monocytes Relative: 7 %
Neutro Abs: 5.9 10*3/uL (ref 1.4–6.5)
Neutrophils Relative %: 65 %
Platelets: 247 10*3/uL (ref 150–440)
RBC: 4.72 MIL/uL (ref 3.80–5.20)
RDW: 13.2 % (ref 11.5–14.5)
WBC: 9.1 10*3/uL (ref 3.6–11.0)

## 2016-06-09 LAB — URINALYSIS COMPLETE WITH MICROSCOPIC (ARMC ONLY)
Bilirubin Urine: NEGATIVE
Glucose, UA: NEGATIVE mg/dL
Hgb urine dipstick: NEGATIVE
Ketones, ur: NEGATIVE mg/dL
Leukocytes, UA: NEGATIVE
Nitrite: NEGATIVE
Protein, ur: NEGATIVE mg/dL
RBC / HPF: NONE SEEN RBC/hpf (ref 0–5)
Specific Gravity, Urine: 1.02 (ref 1.005–1.030)
pH: 7 (ref 5.0–8.0)

## 2016-06-09 LAB — WET PREP, GENITAL
Sperm: NONE SEEN
Trich, Wet Prep: NONE SEEN
Yeast Wet Prep HPF POC: NONE SEEN

## 2016-06-09 LAB — HCG, QUANTITATIVE, PREGNANCY: hCG, Beta Chain, Quant, S: <1 m[IU]/mL (ref <5)

## 2016-06-09 MED ORDER — METRONIDAZOLE 500 MG PO TABS: 500.0000 mg | ORAL_TABLET | Freq: Two times a day (BID) | ORAL | Status: DC

## 2016-06-09 NOTE — ED Provider Notes (Signed)
[facility]  ____________________________________________  Time seen: Approximately 4:06 PM  I have reviewed the triage vital signs and the nursing notes.   HISTORY  Chief Complaint Back Pain and Abdominal Pain   HPI Jeanne Pena is a 30 y.o. female presents for the complaint of low back pain 3 days. Patient reports low back pain is worse with movement and minimal at rest. However patient reports today with onset of right suprapubic pain that has been present since this morning and is not changed by positions. Patient states back pain is at most 5 out of 10 and states suprapubic pain at most is 2 out of 10. Denies known trigger. Denies pain wrapping around the back to anterior abdomen. Patient reports that she has had a UTI in the past in which she had urinary frequency, urinary urgency and low back pain however reports no dysuria at this time. Denies vaginal discharge, vaginal odor, concerns for STDs. Reports sexually active with one partner same partner 10 years.   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 Patient reports that she does not use any barrier contraceptives but reports she does have nexplanon in place. Reports last bowel movement was yesterday and described as normal. Denies any blood in stool.  Denies fall. Denies trauma. Denies fevers, nausea, vomiting, diarrhea, upper back pain, rash, chest pain, shortness of breath or other complaints. Reports normal appetite.  No LMP recorded. Patient has had an implant. LMP in June (states occasional has menstruals)      Past Medical History  Diagnosis Date  . Migraine     There are no active problems to display for this patient.   Past Surgical History  Procedure Laterality Date  . Cholecystectomy    . Cesarean section      Current Outpatient Rx  Name  Route  Sig  Dispense  Refill  . SUMAtriptan (IMITREX) 25 MG tablet    Oral   Take 25 mg by mouth every 2 (two) hours as needed for migraine. May repeat in 2 hours if headache persists or recurs.         .           .             Allergies Review of patient's allergies indicates no known allergies.  History reviewed. No pertinent family history.  Social History Social History  Substance Use Topics  . Smoking status: Never Smoker   . Smokeless tobacco: None  . Alcohol Use: No    Review of Systems Constitutional: No fever/chills Eyes: No visual changes. ENT: No sore throat. Cardiovascular: Denies chest pain. Respiratory: Denies shortness of breath. Gastrointestinal: As above.No nausea, no vomiting.  No diarrhea.  No constipation. Genitourinary: Negative for dysuria. Musculoskeletal: Positive for back pain. Skin: Negative for rash. Neurological: Negative for headaches, focal weakness or numbness.  10-point ROS otherwise negative.  ____________________________________________   PHYSICAL EXAM:  VITAL SIGNS: ED Triage Vitals  Enc Vitals Group     BP 06/09/16 1513 107/72 mmHg     Pulse Rate 06/09/16 1513 87     Resp 06/09/16 1513 16     Temp 06/09/16 1513 98.1 F (36.7 C)     Temp Source 06/09/16 1513 Oral     SpO2 06/09/16 1513 100 %     Weight 06/09/16 1513 165 lb (74.844 kg)     Height 06/09/16 1513 5\' 3"  (1.6 m)  Head Cir --      Peak Flow --      Pain Score 06/09/16 1517 5     Pain Loc --      Pain Edu? --      Excl. in GC? --     Constitutional: Alert and oriented. Well appearing and in no acute distress. Eyes: Conjunctivae are normal. PERRL. EOMI. Head: Atraumatic.   Ears: Normal external appearance bilaterally.   Nose: No congestion/rhinnorhea.  Mouth/Throat: Mucous membranes are moist.  Oropharynx non-erythematous. Neck: No stridor.  No cervical spine tenderness to palpation. Hematological/Lymphatic/Immunilogical: No cervical lymphadenopathy. Cardiovascular: Normal rate, regular rhythm. Grossly normal heart  sounds.  Good peripheral circulation. Respiratory: Normal respiratory effort.  No retractions. Lungs CTAB. No wheezes, rales or rhonchi.  Gastrointestinal: Soft and nontender. No distention. Normal Bowel sounds. No CVA tenderness. No McBurney point tenderness.  Pelvic : Completed with Melissa CMA at bedside a chaperone.  External : No rash or lesions, normal appearance. Speculum: Mild whitish discharge, no bleeding, cervical eyes closed, no foreign body. Bimanual: No cervical motion tenderness, no left sided tenderness, moderate right adnexal tenderness.  Musculoskeletal: No lower or upper extremity tenderness nor edema.  No midline cervical, thoracic or lumbar tenderness to palpation. Patient with minimal to mild para lumbar tenderness with lumbar twisting otherwise nontender.,  Neurologic:  Normal speech and language. No gross focal neurologic deficits are appreciated. No gait instability. Skin:  Skin is warm, dry and intact. No rash noted. Psychiatric: Mood and affect are normal. Speech and behavior are normal.  ____________________________________________   LABS (all labs ordered are listed, but only abnormal results are displayed)  Labs Reviewed  WET PREP, GENITAL - Abnormal; Notable for the following:    Clue Cells Wet Prep HPF POC PRESENT (*)    WBC, Wet Prep HPF POC FEW (*)    All other components within normal limits  URINALYSIS COMPLETEWITH MICROSCOPIC ([facility] ONLY) - Abnormal; Notable for the following:    Bacteria, UA RARE (*)    Squamous Epithelial / LPF 6-30 (*)    All other components within normal limits  URINE CULTURE  CHLAMYDIA/NGC RT PCR ([facility] ONLY)  HCG, QUANTITATIVE, PREGNANCY  CBC WITH DIFFERENTIAL/PLATELET  BASIC METABOLIC PANEL   ____ INITIAL IMPRESSION / ASSESSMENT AND PLAN / ED COURSE  Pertinent labs & imaging results that were available during my care of the patient were reviewed by me and considered in my medical decision making (see chart for  details).  Very well-appearing patient. No acute distress. Presents for the complaints of bilateral lower back pain and right pelvic discomfort. Suspect urinalysis contamination, will culture urine. Pelvic exam completed. Clue cells present. Labs reviewed. HCG negative. As patient has right adnexal tenderness with a history of ovarian torsion will evaluate ultrasound. Patient will go to [facility] regional for outpatient ultrasound. We'll treat BV with oral Flagyl. Patient denies need for pain medication. Encouraged patient to close PCP follow-up. Discussed indication, risks and benefits of medications with patient.  Discussed follow up with Primary care physician this week. Discussed follow up and return parameters including no resolution or any worsening concerns. Patient verbalized understanding and agreed to plan.   ____________________________________________   FINAL CLINICAL IMPRESSION(S) / ED DIAGNOSES  Final diagnoses:  Bacterial vaginosis  Pelvic pain in female     Discharge Medication List as of 06/09/2016  5:19 PM    START taking these medications   Details  metroNIDAZOLE (FLAGYL) 500 MG tablet Take 1 tablet (500 mg total) by  mouth 2 (two) times daily., Starting 06/09/2016, Until Discontinued, Normal        Note: This dictation was prepared with Dragon dictation along with smaller phrase technology. Any transcriptional errors that result from this process are unintentional.       Renford DillsLindsey Annasophia Crocker, NP 06/09/16 1822    Addendum: 06/09/16 1915  Report received from ultrasound tech. See radiology report. Per radiologist, 3.9 cm minimally complex septated right ovarian cyst, normal uterus and endometrium , previous left oophorectomy and normal right ovarian Doppler evaluation. Discuss his findings were telephoned with patient. Patient verbalized understanding. Encouraged patient follow-up with PCP or OB/GYN next week. We will treats bacterial vaginosis with the Flagyl.  Encouraged patient and discussed symptoms to seek sooner evaluation for including increased pain, fevers, no improvement or worsening concerns. Patient verbalized understanding and agreed to plan.   CLINICAL DATA: Acute right adnexal back pain for 1 day. Remote left oophorectomy  EXAM: TRANSABDOMINAL AND TRANSVAGINAL ULTRASOUND OF PELVIS  DOPPLER ULTRASOUND OF OVARIES  TECHNIQUE: Both transabdominal and transvaginal ultrasound examinations of the pelvis were performed. Transabdominal technique was performed for global imaging of the pelvis including uterus, ovaries, adnexal regions, and pelvic cul-de-sac.  It was necessary to proceed with endovaginal exam following the transabdominal exam to visualize the uterus and right ovary and better detail. Color and duplex Doppler ultrasound was utilized to evaluate blood flow to the ovaries.  COMPARISON: 04/24/2012  FINDINGS: Uterus  Measurements: 11.0 x 3.6 x 4.3 cm. No fibroids or other mass visualized.  Endometrium  Thickness: 3 mm. No focal abnormality visualized.  Right ovary  Measurements: 3.7 x 3.8 x 3.9 cm. Left ovary contains a minimally complex septated cyst measuring 3.7 x 3.8 x 3.9 cm  Left ovary  Surgically absent.  Pulsed Doppler evaluation of the right ovary demonstrates normal low-resistance arterial and venous waveforms.  Other findings  No abnormal free fluid.  IMPRESSION: 3.9 cm minimally complex septated right ovarian cyst.  Normal uterus and endometrium  Previous left oophorectomy  Normal right ovarian Doppler evaluation   Electronically Signed  By: Judie PetitM. Shick M.D.  On: 06/09/2016 18:57     Renford DillsLindsey Lachrisha Ziebarth, NP 06/09/16 2120  Renford DillsLindsey Tommaso Cavitt, NP 06/09/16 2121

## 2016-06-09 NOTE — Discharge Instructions (Signed)
Take medication as prescribed. Rest. Drink plenty of fluids.   Follow up with your primary care physician this week. Return to Urgent care for new or worsening concerns.    Bacterial Vaginosis Bacterial vaginosis is a vaginal infection that occurs when the normal balance of bacteria in the vagina is disrupted. It results from an overgrowth of certain bacteria. This is the most common vaginal infection in women of childbearing age. Treatment is important to prevent complications, especially in pregnant women, as it can cause a premature delivery. CAUSES  Bacterial vaginosis is caused by an increase in harmful bacteria that are normally present in smaller amounts in the vagina. Several different kinds of bacteria can cause bacterial vaginosis. However, the reason that the condition develops is not fully understood. RISK FACTORS Certain activities or behaviors can put you at an increased risk of developing bacterial vaginosis, including:  Having a new sex partner or multiple sex partners.  Douching.  Using an intrauterine device (IUD) for contraception. Women do not get bacterial vaginosis from toilet seats, bedding, swimming pools, or contact with objects around them. SIGNS AND SYMPTOMS  Some women with bacterial vaginosis have no signs or symptoms. Common symptoms include:  Grey vaginal discharge.  A fishlike odor with discharge, especially after sexual intercourse.  Itching or burning of the vagina and vulva.  Burning or pain with urination. DIAGNOSIS  Your health care provider will take a medical history and examine the vagina for signs of bacterial vaginosis. A sample of vaginal fluid may be taken. Your health care provider will look at this sample under a microscope to check for bacteria and abnormal cells. A vaginal pH test may also be done.  TREATMENT  Bacterial vaginosis may be treated with antibiotic medicines. These may be given in the form of a pill or a vaginal cream. A  second round of antibiotics may be prescribed if the condition comes back after treatment. Because bacterial vaginosis increases your risk for sexually transmitted diseases, getting treated can help reduce your risk for chlamydia, gonorrhea, HIV, and herpes. HOME CARE INSTRUCTIONS   Only take over-the-counter or prescription medicines as directed by your health care provider.  If antibiotic medicine was prescribed, take it as directed. Make sure you finish it even if you start to feel better.  Tell all sexual partners that you have a vaginal infection. They should see their health care provider and be treated if they have problems, such as a mild rash or itching.  During treatment, it is important that you follow these instructions:  Avoid sexual activity or use condoms correctly.  Do not douche.  Avoid alcohol as directed by your health care provider.  Avoid breastfeeding as directed by your health care provider. SEEK MEDICAL CARE IF:   Your symptoms are not improving after 3 days of treatment.  You have increased discharge or pain.  You have a fever. MAKE SURE YOU:   Understand these instructions.  Will watch your condition.  Will get help right away if you are not doing well or get worse. FOR MORE INFORMATION  Centers for Disease Control and Prevention, Division of STD Prevention: SolutionApps.co.za American Sexual Health Association (ASHA): www.ashastd.org    This information is not intended to replace advice given to you by your health care provider. Make sure you discuss any questions you have with your health care provider.   Document Released: 11/06/2005 Document Revised: 11/27/2014 Document Reviewed: 06/18/2013 Elsevier Interactive Patient Education 2016 Elsevier Inc.  Pelvic Pain, Female Female  pelvic pain can be caused by many different things and start from a variety of places. Pelvic pain refers to pain that is located in the lower half of the abdomen and between  your hips. The pain may occur over a short period of time (acute) or may be reoccurring (chronic). The cause of pelvic pain may be related to disorders affecting the female reproductive organs (gynecologic), but it may also be related to the bladder, kidney stones, an intestinal complication, or muscle or skeletal problems. Getting help right away for pelvic pain is important, especially if there has been severe, sharp, or a sudden onset of unusual pain. It is also important to get help right away because some types of pelvic pain can be life threatening.  CAUSES  Below are only some of the causes of pelvic pain. The causes of pelvic pain can be in one of several categories.   Gynecologic.  Pelvic inflammatory disease.  Sexually transmitted infection.  Ovarian cyst or a twisted ovarian ligament (ovarian torsion).  Uterine lining that grows outside the uterus (endometriosis).  Fibroids, cysts, or tumors.  Ovulation.  Pregnancy.  Pregnancy that occurs outside the uterus (ectopic pregnancy).  Miscarriage.  Labor.  Abruption of the placenta or ruptured uterus.  Infection.  Uterine infection (endometritis).  Bladder infection.  Diverticulitis.  Miscarriage related to a uterine infection (septic abortion).  Bladder.  Inflammation of the bladder (cystitis).  Kidney stone(s).  Gastrointestinal.  Constipation.  Diverticulitis.  Neurologic.  Trauma.  Feeling pelvic pain because of mental or emotional causes (psychosomatic).  Cancers of the bowel or pelvis. EVALUATION  Your caregiver will want to take a careful history of your concerns. This includes recent changes in your health, a careful gynecologic history of your periods (menses), and a sexual history. Obtaining your family history and medical history is also important. Your caregiver may suggest a pelvic exam. A pelvic exam will help identify the location and severity of the pain. It also helps in the evaluation  of which organ system may be involved. In order to identify the cause of the pelvic pain and be properly treated, your caregiver may order tests. These tests may include:   A pregnancy test.  Pelvic ultrasonography.  An X-ray exam of the abdomen.  A urinalysis or evaluation of vaginal discharge.  Blood tests. HOME CARE INSTRUCTIONS   Only take over-the-counter or prescription medicines for pain, discomfort, or fever as directed by your caregiver.   Rest as directed by your caregiver.   Eat a balanced diet.   Drink enough fluids to make your urine clear or pale yellow, or as directed.   Avoid sexual intercourse if it causes pain.   Apply warm or cold compresses to the lower abdomen depending on which one helps the pain.   Avoid stressful situations.   Keep a journal of your pelvic pain. Write down when it started, where the pain is located, and if there are things that seem to be associated with the pain, such as food or your menstrual cycle.  Follow up with your caregiver as directed.  SEEK MEDICAL CARE IF:  Your medicine does not help your pain.  You have abnormal vaginal discharge. SEEK IMMEDIATE MEDICAL CARE IF:   You have heavy bleeding from the vagina.   Your pelvic pain increases.   You feel light-headed or faint.   You have chills.   You have pain with urination or blood in your urine.   You have uncontrolled diarrhea or vomiting.  You have a fever or persistent symptoms for more than 3 days.  You have a fever and your symptoms suddenly get worse.   You are being physically or sexually abused.   This information is not intended to replace advice given to you by your health care provider. Make sure you discuss any questions you have with your health care provider.   Document Released: 10/03/2004 Document Revised: 07/28/2015 Document Reviewed: 02/26/2012 Elsevier Interactive Patient Education [employer].

## 2016-06-09 NOTE — ED Notes (Signed)
Low back pain, suprapubic abd pain, x 3 days. Worse with movement and has worsened today. Denies injury.

## 2016-06-10 ENCOUNTER — Telehealth: Payer: Self-pay | Admitting: Emergency Medicine

## 2016-06-10 MED ORDER — TRAMADOL HCL 50 MG PO TABS: 50.0000 mg | ORAL_TABLET | Freq: Three times a day (TID) | ORAL | Status: DC | PRN

## 2016-06-10 NOTE — Telephone Encounter (Signed)
Patient called and discussed with RN and myself regarding request for pain medication prescription. Patient was seen by myself yesterday. Patient has bacterial vaginosis and a right ovarian cyst. Patient states the pain is in the same location as it was yesterday and at the same level. Patient states yesterday she did not request any pain medication but states last night the pain aggravated her at night prompting her request. Discussed in detail with patient we'll give prescription for tramadol as needed for pain. However cautioned and encouraged patient for any change or increase of pain to proceed directly to the emergency room for further evaluation as previously discussed. Patient verbalized understanding and agreed to this plan.

## 2016-06-10 NOTE — Telephone Encounter (Signed)
Patient called stating that OTC pain medication is not helping with her lower pelvic pain and is requesting something stronger.  Renford Dills, NP reviewed patient's chart and spoke with patient and will give her a prescription for Tramadol to help with her pain  Patient was instructed to follow-up with her doctor on Monday and to go to the ER if her symptoms worsen.  Patient was also instructed that she would need to come and pick up her prescription at [facility] to take to the pharmacy.  Patient was advised to not drive while taking this pain medicine.  Patient verbalized understanding.

## 2016-06-12 LAB — URINE CULTURE
Culture: >=100000 — AB
Special Requests: NORMAL

## 2016-06-13 ENCOUNTER — Telehealth: Payer: Self-pay | Admitting: *Deleted

## 2016-06-13 NOTE — Telephone Encounter (Signed)
Patient returned phone call and was informed that her chlamydia and gonorrhea results came back negative. Patient confirmed understanding of information.

## 2016-06-14 ENCOUNTER — Telehealth: Payer: Self-pay | Admitting: Emergency Medicine

## 2016-06-14 MED ORDER — NITROFURANTOIN MONOHYD MACRO 100 MG PO CAPS: 100.0000 mg | ORAL_CAPSULE | Freq: Two times a day (BID) | ORAL | 0 refills | Status: DC

## 2016-06-14 NOTE — Telephone Encounter (Signed)
Patient returned my call. I informed patient of her urine culture result and and that Dr. Thurmond Butts reviewed her chart and would like for her to take Macrobid in addition to the Flagyl that she is already taking.  Dr. Thurmond Butts would like Macrobid 100mg  1 capsule twice a day for 7 days sent to her pharmacy.  Patient was instructed to make sure that she finishes all of her antibiotic and to follow-up with her GYN physician as advised.  Patient verbalized understanding.

## 2016-06-19 ENCOUNTER — Emergency Department
Admission: EM | Admit: 2016-06-19 | Discharge: 2016-06-19 | Disposition: A | Discharge status: Home / Self Care | Payer: Medicaid Other | Attending: Emergency Medicine | Admitting: Emergency Medicine

## 2016-06-19 ENCOUNTER — Emergency Department: Payer: Medicaid Other

## 2016-06-19 ENCOUNTER — Encounter: Payer: Self-pay | Admitting: Emergency Medicine

## 2016-06-19 DIAGNOSIS — M5442 Lumbago with sciatica, left side: Secondary | ICD-10-CM | POA: Insufficient documentation

## 2016-06-19 DIAGNOSIS — M545 Low back pain: Secondary | ICD-10-CM | POA: Diagnosis present

## 2016-06-19 MED ORDER — KETOROLAC TROMETHAMINE 60 MG/2ML IM SOLN
60.0000 mg | Freq: Once | INTRAMUSCULAR | Status: AC
  Administered 2016-06-19: 60 mg via INTRAMUSCULAR
  Filled 2016-06-19: qty 2

## 2016-06-19 MED ORDER — METHOCARBAMOL 750 MG PO TABS: 750.0000 mg | ORAL_TABLET | Freq: Four times a day (QID) | ORAL | 0 refills | Status: DC

## 2016-06-19 MED ORDER — DIAZEPAM 5 MG/ML IJ SOLN: 5.0000 mg | Freq: Once | INTRAMUSCULAR | Status: DC

## 2016-06-19 MED ORDER — OXYCODONE-ACETAMINOPHEN 5-325 MG PO TABS: 1.0000 | ORAL_TABLET | Freq: Once | ORAL | Status: DC

## 2016-06-19 MED ORDER — MELOXICAM 15 MG PO TABS: 15.0000 mg | ORAL_TABLET | Freq: Every day | ORAL | 0 refills | Status: DC

## 2016-06-19 MED ORDER — OXYCODONE-ACETAMINOPHEN 5-325 MG PO TABS: 1.0000 | ORAL_TABLET | ORAL | 0 refills | Status: DC | PRN

## 2016-06-19 MED ORDER — KETOROLAC TROMETHAMINE 60 MG/2ML IM SOLN: 60.0000 mg | Freq: Once | INTRAMUSCULAR | Status: DC

## 2016-06-19 NOTE — ED Notes (Signed)
Patient transported to X-ray 

## 2016-06-19 NOTE — ED Triage Notes (Signed)
States has had lower back pain x 3 weeks. Today started radiating down R leg. Pain increases with movement.

## 2016-06-19 NOTE — ED Provider Notes (Signed)
[facility] Emergency Department Provider Note  ____________________________________________  Time seen: Approximately 1:23 PM  I have reviewed the triage vital signs and the nursing notes.   HISTORY  Chief Complaint Back Pain    HPI Jeanne Pena is a 30 y.o. female presents for evaluation of low back pain 3 weeks. Patient states the pain is increased and worsened with movement. Patient states pain is now radiating down her right leg. Denies any numbness in the groin or paresthesia.Patient describes the pain as 10 over 10 with no relief with over-the-counter medications.   Past Medical History:  Diagnosis Date  . Migraine     There are no active problems to display for this patient.   Past Surgical History:  Procedure Laterality Date  . CESAREAN SECTION    . CHOLECYSTECTOMY    . LAPAROSCOPIC OOPHERECTOMY     l    Prior to Admission medications   Medication Sig Start Date End Date Taking? Authorizing Provider  brompheniramine-pseudoephedrine-DM 30-2-10 MG/5ML syrup Take 10 mLs by mouth 4 (four) times daily as needed. 01/11/16   Jami L Hagler, PA-C  fluticasone (FLONASE) 50 MCG/ACT nasal spray Place 2 sprays into both nostrils daily. 01/11/16   Jami L Hagler, PA-C  meloxicam (MOBIC) 15 MG tablet Take 1 tablet (15 mg total) by mouth daily. 06/19/16   Charmayne Sheer Beers, PA-C  methocarbamol (ROBAXIN) 750 MG tablet Take 1 tablet (750 mg total) by mouth 4 (four) times daily. 06/19/16   Charmayne Sheer Beers, PA-C  metroNIDAZOLE (FLAGYL) 500 MG tablet Take 1 tablet (500 mg total) by mouth 2 (two) times daily. 06/09/16   Renford Dills, NP  nitrofurantoin, macrocrystal-monohydrate, (MACROBID) 100 MG capsule Take 1 capsule (100 mg total) by mouth 2 (two) times daily. 06/14/16   Hassan Rowan, MD  oxyCODONE-acetaminophen (ROXICET) 5-325 MG tablet Take 1-2 tablets by mouth every 4 (four) hours as needed for severe pain. 06/19/16   Evangeline Dakin, PA-C  SUMAtriptan  (IMITREX) 25 MG tablet Take 25 mg by mouth every 2 (two) hours as needed for migraine. May repeat in 2 hours if headache persists or recurs.    Historical Provider, MD    Allergies Review of patient's allergies indicates no known allergies.  No family history on file.  Social History Social History  Substance Use Topics  . Smoking status: Never Smoker  . Smokeless tobacco: Not on file  . Alcohol use No  BP 112/71   Pulse 78   Temp 98.2 F (36.8 C) (Oral)   Resp 18   Ht  (1.575 m)   Wt 76.2 kg   LMP 06/19/2016   SpO2 99%   BMI 30.73 kg/m    Review of Systems Constitutional: No fever/chills Cardiovascular: Denies chest pain. Respiratory: Denies shortness of breath. Genitourinary: Negative for dysuria. Musculoskeletal: Positive for low back pain. Skin: Negative for rash. Neurological: Negative for headaches, focal weakness or numbness.  10-point ROS otherwise negative.  ____________________________________________   PHYSICAL EXAM:  VITAL SIGNS: ED Triage Vitals  Enc Vitals Group     BP 06/19/16 1248 112/71     Pulse Rate 06/19/16 1248 78     Resp 06/19/16 1248 18     Temp 06/19/16 1248 98.2 F (36.8 C)     Temp Source 06/19/16 1248 Oral     SpO2 06/19/16 1248 99 %     Weight 06/19/16 1248 168 lb (76.2 kg)     Height 06/19/16 1248  (1.575 m)  Head Circumference --      Peak Flow --      Pain Score 06/19/16 1249 8     Pain Loc --      Pain Edu? --      Excl. in GC? --     Constitutional: Alert and oriented. Well appearing and in no acute distress. Cardiovascular: Normal rate, regular rhythm. Grossly normal heart sounds.  Good peripheral circulation. Respiratory: Normal respiratory effort.  No retractions. Lungs CTAB. Musculoskeletal: No lower extremity tenderness nor edema.  No joint effusions.Straight leg raise negative.Distally neurovascularly intact. Neurologic:  Normal speech and language. No gross focal neurologic deficits are  appreciated. No gait instability. Skin:  Skin is warm, dry and intact. No rash noted. Psychiatric: Mood and affect are normal. Speech and behavior are normal.  ____________________________________________   LABS (all labs ordered are listed, but only abnormal results are displayed)  Labs Reviewed  POC URINE PREG, ED   ____________________________________________  EKG   ____________________________________________  RADIOLOGY  No acute osseous findings of lumbar spine. ____________________________________________   PROCEDURES  Procedure(s) performed: None  Critical Care performed: No  ____________________________________________   INITIAL IMPRESSION / ASSESSMENT AND PLAN / ED COURSE  Pertinent labs & imaging results that were available during my care of the patient were reviewed by me and considered in my medical decision making (see chart for details).  Acute exacerbation of lumbar/musculoskeletal strain. Rx given for meloxicam and Robaxin. Patient will PCP or return to ER with any worsening symptomology.  Clinical Course    ____________________________________________   FINAL CLINICAL IMPRESSION(S) / ED DIAGNOSES  Final diagnoses:  Left-sided low back pain with left-sided sciatica     This chart was dictated using voice recognition software/Dragon. Despite best efforts to proofread, errors can occur which can change the meaning. Any change was purely unintentional.    Evangeline Dakin, PA-C 06/19/16 1609    Sharyn Creamer, MD 06/19/16 1630

## 2017-06-22 NOTE — Progress Notes (Signed)
 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 She has had it before. We specifically discussed package labelling with mullerian anomalies as a contraindication to IUD. We discussed the reason for that package labeling. We also discussed that they can be effective contraception and menstrual control for women with uterine anatomy like her. Moreover, it worked for her in the past. In that setting, we offered to place the IUD with the understanding that she is at increased risk of expulsion and pregnancy, potentially. She was willing to accept this risk. I saw and evaluated the patient, participating in the key elements of the service.  I discussed the findings, assessment and plan with the resident and agree with resident's findings and plan as documented in the resident's note.  I was immediately available for the entirety of the procedure(s) and present for the key and critical portions. Gleen JINNY Gunther, MD

## 2018-03-29 ENCOUNTER — Encounter: Payer: Self-pay | Admitting: Emergency Medicine

## 2018-03-29 ENCOUNTER — Other Ambulatory Visit: Payer: Self-pay

## 2018-03-29 ENCOUNTER — Emergency Department: Payer: Self-pay

## 2018-03-29 DIAGNOSIS — Z5321 Procedure and treatment not carried out due to patient leaving prior to being seen by health care provider: Secondary | ICD-10-CM | POA: Insufficient documentation

## 2018-03-29 DIAGNOSIS — R102 Pelvic and perineal pain: Secondary | ICD-10-CM | POA: Insufficient documentation

## 2018-03-29 LAB — URINALYSIS, ROUTINE W REFLEX MICROSCOPIC
Bilirubin Urine: NEGATIVE
Glucose, UA: NEGATIVE mg/dL
Hgb urine dipstick: NEGATIVE
Ketones, ur: NEGATIVE mg/dL
Nitrite: NEGATIVE
Protein, ur: NEGATIVE mg/dL
Specific Gravity, Urine: 1.018 (ref 1.005–1.030)
pH: 6 (ref 5.0–8.0)

## 2018-03-29 LAB — POCT PREGNANCY, URINE: Preg Test, Ur: NEGATIVE

## 2018-03-29 NOTE — ED Triage Notes (Signed)
Pt states that she has been lower pelvic pain x 3 days which has been increasing in intensity. Pt reports hx of contortion and ovarian cysts. Pt is in NAD.

## 2018-03-30 ENCOUNTER — Emergency Department
Admission: EM | Admit: 2018-03-30 | Discharge: 2018-03-30 | Disposition: A | Discharge status: Home / Self Care | Payer: Self-pay | Attending: Emergency Medicine | Admitting: Emergency Medicine

## 2018-03-30 DIAGNOSIS — R102 Pelvic and perineal pain: Secondary | ICD-10-CM

## 2018-04-01 ENCOUNTER — Emergency Department
Admission: EM | Admit: 2018-04-01 | Discharge: 2018-04-01 | Disposition: A | Discharge status: Home / Self Care | Payer: Self-pay | Attending: Emergency Medicine | Admitting: Emergency Medicine

## 2018-04-01 ENCOUNTER — Other Ambulatory Visit: Payer: Self-pay

## 2018-04-01 ENCOUNTER — Encounter: Payer: Self-pay | Admitting: Emergency Medicine

## 2018-04-01 DIAGNOSIS — A5901 Trichomonal vulvovaginitis: Secondary | ICD-10-CM | POA: Insufficient documentation

## 2018-04-01 DIAGNOSIS — N390 Urinary tract infection, site not specified: Secondary | ICD-10-CM | POA: Insufficient documentation

## 2018-04-01 DIAGNOSIS — Z79899 Other long term (current) drug therapy: Secondary | ICD-10-CM | POA: Insufficient documentation

## 2018-04-01 DIAGNOSIS — R1904 Left lower quadrant abdominal swelling, mass and lump: Secondary | ICD-10-CM | POA: Insufficient documentation

## 2018-04-01 LAB — URINALYSIS, COMPLETE (UACMP) WITH MICROSCOPIC
Bilirubin Urine: NEGATIVE
Glucose, UA: NEGATIVE mg/dL
Ketones, ur: NEGATIVE mg/dL
Nitrite: NEGATIVE
Protein, ur: NEGATIVE mg/dL
Specific Gravity, Urine: 1.005 (ref 1.005–1.030)
pH: 7 (ref 5.0–8.0)

## 2018-04-01 LAB — CBC WITH DIFFERENTIAL/PLATELET
Basophils Absolute: 0 10*3/uL (ref 0–0.1)
Basophils Relative: 0 %
Eosinophils Absolute: 0 10*3/uL (ref 0–0.7)
Eosinophils Relative: 0 %
HCT: 39 % (ref 35.0–47.0)
Hemoglobin: 13.1 g/dL (ref 12.0–16.0)
Lymphocytes Relative: 16 %
Lymphs Abs: 1.3 10*3/uL (ref 1.0–3.6)
MCH: 29.4 pg (ref 26.0–34.0)
MCHC: 33.7 g/dL (ref 32.0–36.0)
MCV: 87.2 fL (ref 80.0–100.0)
Monocytes Absolute: 0.8 10*3/uL (ref 0.2–0.9)
Monocytes Relative: 10 %
Neutro Abs: 6 10*3/uL (ref 1.4–6.5)
Neutrophils Relative %: 74 %
Platelets: 196 10*3/uL (ref 150–440)
RBC: 4.48 MIL/uL (ref 3.80–5.20)
RDW: 12.8 % (ref 11.5–14.5)
WBC: 8.1 10*3/uL (ref 3.6–11.0)

## 2018-04-01 LAB — WET PREP, GENITAL
Clue Cells Wet Prep HPF POC: NONE SEEN
Sperm: NONE SEEN
Yeast Wet Prep HPF POC: NONE SEEN

## 2018-04-01 LAB — COMPREHENSIVE METABOLIC PANEL
ALT: 38 U/L (ref 14–54)
AST: 28 U/L (ref 15–41)
Albumin: 4.3 g/dL (ref 3.5–5.0)
Alkaline Phosphatase: 101 U/L (ref 38–126)
Anion gap: 10 (ref 5–15)
BUN: 8 mg/dL (ref 6–20)
CO2: 26 mmol/L (ref 22–32)
Calcium: 9.7 mg/dL (ref 8.9–10.3)
Chloride: 98 mmol/L — ABNORMAL LOW (ref 101–111)
Creatinine, Ser: 0.67 mg/dL (ref 0.44–1.00)
GFR calc Af Amer: >60 mL/min (ref >60)
GFR calc non Af Amer: >60 mL/min (ref >60)
Glucose, Bld: 107 mg/dL — ABNORMAL HIGH (ref 65–99)
Potassium: 3.7 mmol/L (ref 3.5–5.1)
Sodium: 134 mmol/L — ABNORMAL LOW (ref 135–145)
Total Bilirubin: 0.7 mg/dL (ref 0.3–1.2)
Total Protein: 8.2 g/dL — ABNORMAL HIGH (ref 6.5–8.1)

## 2018-04-01 LAB — CHLAMYDIA/NGC RT PCR (ARMC ONLY)
Chlamydia Tr: NOT DETECTED
N gonorrhoeae: NOT DETECTED

## 2018-04-01 LAB — POCT PREGNANCY, URINE: Preg Test, Ur: NEGATIVE

## 2018-04-01 MED ORDER — CEFTRIAXONE SODIUM 250 MG IJ SOLR
250.0000 mg | Freq: Once | INTRAMUSCULAR | Status: AC
  Administered 2018-04-01: 250 mg via INTRAMUSCULAR
  Filled 2018-04-01: qty 250

## 2018-04-01 MED ORDER — CEPHALEXIN 500 MG PO CAPS: 500.0000 mg | ORAL_CAPSULE | Freq: Two times a day (BID) | ORAL | 0 refills | Status: AC

## 2018-04-01 MED ORDER — METRONIDAZOLE 500 MG PO TABS: 500.0000 mg | ORAL_TABLET | Freq: Two times a day (BID) | ORAL | 0 refills | Status: AC

## 2018-04-01 MED ORDER — METRONIDAZOLE 500 MG PO TABS
500.0000 mg | ORAL_TABLET | Freq: Once | ORAL | Status: AC
  Administered 2018-04-01: 500 mg via ORAL
  Filled 2018-04-01: qty 1

## 2018-04-01 MED ORDER — AZITHROMYCIN 500 MG PO TABS
1000.0000 mg | ORAL_TABLET | Freq: Once | ORAL | Status: AC
  Administered 2018-04-01: 1000 mg via ORAL
  Filled 2018-04-01: qty 2

## 2018-04-01 NOTE — ED Notes (Signed)
Lab notified to add on Urine Culture. 

## 2018-04-01 NOTE — ED Notes (Signed)
Pt c/o pain to left groin where had swollen lymph node she was seen for Friday.  Chills at home.  Pain in both legs. Ambulatory to room. Warm dry and pink.

## 2018-04-01 NOTE — ED Provider Notes (Signed)
[facility] Emergency Department Provider Note  ___________________________________________   First MD Initiated Contact with Patient 04/01/18 1651     (approximate)  I have reviewed the triage vital signs and the nursing notes.   HISTORY  Chief Complaint No chief complaint on file.  HPI Jeanne Pena is a 32 y.o. female with history of migraine headaches was presented emergency department with 3 to 4 days of a tender lump to her left pelvic region.  She states that the lump has been getting larger over the past several days.  She is concerned for "HIV" after googling her symptoms.  Does not report fever.  Says that she is a vaginal discharge with this is her baseline vaginal discharge.  No vaginal bleeding.  No abdominal pain.  No difficulty with nausea vomiting or diarrhea.  Says that she is eating and drinking without issue.  Says that the lump hurts worse with movement.  Was here several days ago for evaluation for ovarian cysts.  Says that at the time the ultrasound tech to place a probe on the lesion and said that it appeared as a lymph node.   Past Medical History:  Diagnosis Date  . Migraine     There are no active problems to display for this patient.   Past Surgical History:  Procedure Laterality Date  . CESAREAN SECTION    . CHOLECYSTECTOMY    . LAPAROSCOPIC OOPHERECTOMY     l    Prior to Admission medications   Medication Sig Start Date End Date Taking? Authorizing Provider  brompheniramine-pseudoephedrine-DM 30-2-10 MG/5ML syrup Take 10 mLs by mouth 4 (four) times daily as needed. 01/11/16   Hagler, Jami L, PA-C  fluticasone (FLONASE) 50 MCG/ACT nasal spray Place 2 sprays into both nostrils daily. 01/11/16   Hagler, Jami L, PA-C  meloxicam (MOBIC) 15 MG tablet Take 1 tablet (15 mg total) by mouth daily. 06/19/16   Beers, Charmayne Sheer, PA-C  methocarbamol (ROBAXIN) 750 MG tablet Take 1 tablet (750 mg total) by mouth 4 (four) times daily.  06/19/16   Beers, Charmayne Sheer, PA-C  metroNIDAZOLE (FLAGYL) 500 MG tablet Take 1 tablet (500 mg total) by mouth 2 (two) times daily. 06/09/16   Renford Dills, NP  nitrofurantoin, macrocrystal-monohydrate, (MACROBID) 100 MG capsule Take 1 capsule (100 mg total) by mouth 2 (two) times daily. 06/14/16   Hassan Rowan, MD  oxyCODONE-acetaminophen (ROXICET) 5-325 MG tablet Take 1-2 tablets by mouth every 4 (four) hours as needed for severe pain. 06/19/16   Beers, Charmayne Sheer, PA-C  SUMAtriptan (IMITREX) 25 MG tablet Take 25 mg by mouth every 2 (two) hours as needed for migraine. May repeat in 2 hours if headache persists or recurs.    [provider]    Allergies Patient has no known allergies.  No family history on file.  Social History Social History   Tobacco Use  . Smoking status: Never Smoker  . Smokeless tobacco: Never Used  Substance Use Topics  . Alcohol use: No  . Drug use: No    Review of Systems  Constitutional: No fever/chills Eyes: No visual changes. ENT: No sore throat. Cardiovascular: Denies chest pain. Respiratory: Denies shortness of breath. Gastrointestinal: No abdominal pain.  No nausea, no vomiting.  No diarrhea.  No constipation. Genitourinary: Negative for dysuria. Musculoskeletal: Negative for back pain. Skin: Negative for rash. Neurological: Negative for headaches, focal weakness or numbness.   ____________________________________________   PHYSICAL EXAM:  VITAL SIGNS: ED Triage Vitals [04/01/18  1602]  Enc Vitals Group     BP 112/82     Pulse Rate 98     Resp 16     Temp 100 F (37.8 C)     Temp Source Oral     SpO2 100 %     Weight      Height      Head Circumference      Peak Flow      Pain Score 10     Pain Loc      Pain Edu?      Excl. in GC?     Constitutional: Alert and oriented. Well appearing and in no acute distress. Eyes: Conjunctivae are normal.  Head: Atraumatic. Nose: No congestion/rhinnorhea. Mouth/Throat: Mucous  membranes are moist.  Neck: No stridor.   Cardiovascular: Normal rate, regular rhythm. Grossly normal heart sounds.  Good peripheral circulation. Respiratory: Normal respiratory effort.  No retractions. Lungs CTAB. Gastrointestinal: Soft and nontender. No distention. No CVA tenderness. Genitourinary:   No external lesions.  Normal to gross examination.  Single, 2 to 3 cm mass along the inguinal lymph chain on the left side that is tender to palpation.  Nonreducible.  Mobile.  No overlying erythema, induration or exudates.  Speculum exam with a small amount of yellow discharge.  No CMT.  No uterine or adnexal tenderness nor masses.  Musculoskeletal: No lower extremity tenderness nor edema.  No joint effusions. Neurologic:  Normal speech and language. No gross focal neurologic deficits are appreciated. Skin:  Skin is warm, dry and intact. No rash noted. Psychiatric: Mood and affect are normal. Speech and behavior are normal.  ____________________________________________   LABS (all labs ordered are listed, but only abnormal results are displayed)  Labs Reviewed  WET PREP, GENITAL - Abnormal; Notable for the following components:      Result Value   Trich, Wet Prep PRESENT (*)    WBC, Wet Prep HPF POC MODERATE (*)    All other components within normal limits  COMPREHENSIVE METABOLIC PANEL - Abnormal; Notable for the following components:   Sodium 134 (*)    Chloride 98 (*)    Glucose, Bld 107 (*)    Total Protein 8.2 (*)    All other components within normal limits  URINALYSIS, COMPLETE (UACMP) WITH MICROSCOPIC - Abnormal; Notable for the following components:   Color, Urine YELLOW (*)    APPearance CLEAR (*)    Hgb urine dipstick SMALL (*)    Leukocytes, UA LARGE (*)    Bacteria, UA RARE (*)    All other components within normal limits  CHLAMYDIA/NGC RT PCR ([facility] ONLY)  CBC WITH DIFFERENTIAL/PLATELET  POC URINE PREG, ED  POCT PREGNANCY, URINE    ____________________________________________  EKG  ____________________________________________  RADIOLOGY  ____________________________________________   PROCEDURES  Procedure(s) performed:   Procedures  Critical Care performed:   ____________________________________________   INITIAL IMPRESSION / ASSESSMENT AND PLAN / ED COURSE  Pertinent labs & imaging results that were available during my care of the patient were reviewed by me and considered in my medical decision making (see chart for details).  DDX: UTI, STD,, lymphadenopathy, inguinal hernia, PID As part of my medical decision making, I reviewed the following data within the electronic MEDICAL RECORD NUMBER Notes from prior ED visits  ----------------------------------------- 8:07 PM on 04/01/2018 -----------------------------------------  Patient positive for trichomonas.  Will be treated for cervicitis as well.  Patient aware that she will need to follow-up with her primary care doctor for further testing  including HIV, syphilis and herpes.  She knows that she must tell her partner and that the partner must also be tested and treated and that neither of them should be having any sexual contact until they are fully tested, treated and cleared to do so by healthcare provider.  She is understanding of the treatment plan willing to comply. ____________________________________________   FINAL CLINICAL IMPRESSION(S) / ED DIAGNOSES  Trichomonas.  UTI.    NEW MEDICATIONS STARTED DURING THIS VISIT:  New Prescriptions   No medications on file     Note:  This document was prepared using Dragon voice recognition software and may include unintentional dictation errors.     Myrna Blazer, MD 04/01/18 2007

## 2018-04-01 NOTE — ED Triage Notes (Signed)
Pt to ED via POV ,c/o pelvic pain .  was seen here xfew days ago and dx with swollen lymph nodes but since has had increased pain and chills at home. VSS, low grade fever 100.

## 2018-04-03 LAB — URINE CULTURE

## 2018-11-01 ENCOUNTER — Emergency Department
Admission: EM | Admit: 2018-11-01 | Discharge: 2018-11-02 | Disposition: A | Discharge status: Home / Self Care | Payer: Self-pay | Attending: Emergency Medicine | Admitting: Emergency Medicine

## 2018-11-01 ENCOUNTER — Encounter: Payer: Self-pay | Admitting: Emergency Medicine

## 2018-11-01 ENCOUNTER — Emergency Department: Payer: Self-pay

## 2018-11-01 DIAGNOSIS — T50905A Adverse effect of unspecified drugs, medicaments and biological substances, initial encounter: Secondary | ICD-10-CM

## 2018-11-01 DIAGNOSIS — Z79899 Other long term (current) drug therapy: Secondary | ICD-10-CM | POA: Insufficient documentation

## 2018-11-01 DIAGNOSIS — X31XXXA Exposure to excessive natural cold, initial encounter: Secondary | ICD-10-CM | POA: Insufficient documentation

## 2018-11-01 DIAGNOSIS — R1110 Vomiting, unspecified: Secondary | ICD-10-CM | POA: Insufficient documentation

## 2018-11-01 DIAGNOSIS — Y69 Unspecified misadventure during surgical and medical care: Secondary | ICD-10-CM | POA: Insufficient documentation

## 2018-11-01 DIAGNOSIS — T68XXXA Hypothermia, initial encounter: Secondary | ICD-10-CM | POA: Insufficient documentation

## 2018-11-01 DIAGNOSIS — R112 Nausea with vomiting, unspecified: Secondary | ICD-10-CM

## 2018-11-01 DIAGNOSIS — E876 Hypokalemia: Secondary | ICD-10-CM | POA: Insufficient documentation

## 2018-11-01 DIAGNOSIS — T887XXA Unspecified adverse effect of drug or medicament, initial encounter: Secondary | ICD-10-CM | POA: Insufficient documentation

## 2018-11-01 LAB — COMPREHENSIVE METABOLIC PANEL
ALT: 16 U/L (ref 0–44)
AST: 23 U/L (ref 15–41)
Albumin: 4.5 g/dL (ref 3.5–5.0)
Alkaline Phosphatase: 81 U/L (ref 38–126)
Anion gap: 13 (ref 5–15)
BUN: 12 mg/dL (ref 6–20)
CO2: 19 mmol/L — ABNORMAL LOW (ref 22–32)
Calcium: 9 mg/dL (ref 8.9–10.3)
Chloride: 103 mmol/L (ref 98–111)
Creatinine, Ser: 0.75 mg/dL (ref 0.44–1.00)
GFR calc Af Amer: >60 mL/min (ref >60)
GFR calc non Af Amer: >60 mL/min (ref >60)
Glucose, Bld: 169 mg/dL — ABNORMAL HIGH (ref 70–99)
Potassium: 2.8 mmol/L — ABNORMAL LOW (ref 3.5–5.1)
Sodium: 135 mmol/L (ref 135–145)
Total Bilirubin: 0.7 mg/dL (ref 0.3–1.2)
Total Protein: 7.8 g/dL (ref 6.5–8.1)

## 2018-11-01 LAB — CBC
HCT: 38.7 % (ref 36.0–46.0)
Hemoglobin: 13.3 g/dL (ref 12.0–15.0)
MCH: 29.6 pg (ref 26.0–34.0)
MCHC: 34.4 g/dL (ref 30.0–36.0)
MCV: 86 fL (ref 80.0–100.0)
Platelets: 229 10*3/uL (ref 150–400)
RBC: 4.5 MIL/uL (ref 3.87–5.11)
RDW: 11.8 % (ref 11.5–15.5)
WBC: 9.2 10*3/uL (ref 4.0–10.5)
nRBC: 0 % (ref 0.0–0.2)

## 2018-11-01 LAB — ETHANOL: Alcohol, Ethyl (B): <10 mg/dL (ref <10)

## 2018-11-01 LAB — LIPASE, BLOOD: Lipase: 29 U/L (ref 11–51)

## 2018-11-01 MED ORDER — SODIUM CHLORIDE 0.9 % IV BOLUS
1000.0000 mL | Freq: Once | INTRAVENOUS | Status: AC
  Administered 2018-11-02: 1000 mL via INTRAVENOUS

## 2018-11-01 MED ORDER — SODIUM CHLORIDE 0.9 % IV BOLUS
1000.0000 mL | Freq: Once | INTRAVENOUS | Status: AC
  Administered 2018-11-01: 1000 mL via INTRAVENOUS

## 2018-11-01 MED ORDER — ONDANSETRON HCL 4 MG/2ML IJ SOLN
4.0000 mg | Freq: Once | INTRAMUSCULAR | Status: AC | PRN
  Administered 2018-11-01: 4 mg via INTRAVENOUS
  Filled 2018-11-01: qty 2

## 2018-11-01 NOTE — ED Notes (Signed)
Patient transported to CT 

## 2018-11-01 NOTE — ED Triage Notes (Signed)
Patient actively vomiting. Patient brought in by mother for emesis and headache. Patient just keeps stating "i'm sorry, i'm sorry". Patient not answering questions.

## 2018-11-01 NOTE — ED Notes (Signed)
Ct pam, notified of need for ct head.

## 2018-11-01 NOTE — ED Notes (Signed)
Report from jeanette, rn.  

## 2018-11-01 NOTE — ED Notes (Signed)
Patient bought to ed by parent. Reports was at a friends house and called her parent she was feeling sick. When parent arrived patient was vomiting and c/o about abd pain. Patient not responding to verbal commands in room. Crying and c/o of not feeling well. Vomit x 1 upon arrival into ed room. Parent at bedide. Bed low call light within reach. Safety maintained. Labs drawn and sent. ivf infusing. Will monitor.

## 2018-11-02 MED ORDER — POTASSIUM CHLORIDE CRYS ER 20 MEQ PO TBCR
EXTENDED_RELEASE_TABLET | ORAL | Status: AC
  Filled 2018-11-02: qty 2

## 2018-11-02 MED ORDER — POTASSIUM CHLORIDE 20 MEQ PO PACK: 40.0000 meq | PACK | ORAL | Status: DC

## 2018-11-02 MED ORDER — ONDANSETRON 4 MG PO TBDP: ORAL_TABLET | ORAL | 0 refills | Status: DC

## 2018-11-02 MED ORDER — POTASSIUM CHLORIDE CRYS ER 20 MEQ PO TBCR
40.0000 meq | EXTENDED_RELEASE_TABLET | Freq: Once | ORAL | Status: AC
  Administered 2018-11-02: 40 meq via ORAL

## 2018-11-02 NOTE — ED Notes (Signed)
Pt states is comfortable.

## 2018-11-02 NOTE — ED Provider Notes (Signed)
[facility] Emergency Department Provider Note  ____________________________________________   First MD Initiated Contact with Patient 11/01/18 2306     (approximate)  I have reviewed the triage vital signs and the nursing notes.   HISTORY  Chief Complaint Headache and Emesis    HPI Jeanne Pena is a 32 y.o. female with a chronic medical history that only includes migraine who presents by private vehicle with her mother for evaluation of vomiting and somewhat altered mental status.  The mother provided the history but the patient supported the history and agreed with it.  Apparently the patient was in her normal state of health today with no concerns or complaints when she went over to a friend's house.  They smoked with a believed to be marijuana, which allegedly the patient is not used to.  She had some sort of reaction or tach while smoking and she felt very hot and flushed and nauseated and went outside into the cold rain (it is approximately 40 degrees and raining tonight).  She was outside for at least 30 minutes and was soaking wet and cold when her mother arrived and brought her to the hospital.  When she initially arrived she was mumbling "I am sorry, I am sorry" but was not answering questions.  She is "come around" quite a bit according to her mother and is now acting more herself but keeps moaning.  The patient denies any specific complaints, just states that she feels bad all over.  She is now denying headache.  She states that she has no neck pain or stiffness and she felt fine before smoking the marijuana.  After Zofran 4 mg IV she has no longer nauseated but states that the symptoms were severe earlier.  She denies chest pain, shortness of breath, abdominal pain, and dysuria.  Nothing in particular made her better or worse except for the Zofran.  She reports that the headache she had before was gradual in onset, was not thunderclap, and she has no  numbness nor tingling nor weakness in any of her extremities.  Past Medical History:  Diagnosis Date  . Migraine     There are no active problems to display for this patient.   Past Surgical History:  Procedure Laterality Date  . CESAREAN SECTION    . CHOLECYSTECTOMY    . LAPAROSCOPIC OOPHERECTOMY     l    Prior to Admission medications   Medication Sig Start Date End Date Taking? Authorizing Provider  brompheniramine-pseudoephedrine-DM 30-2-10 MG/5ML syrup Take 10 mLs by mouth 4 (four) times daily as needed. 01/11/16   Hagler, Jami L, PA-C  fluticasone (FLONASE) 50 MCG/ACT nasal spray Place 2 sprays into both nostrils daily. 01/11/16   Hagler, Jami L, PA-C  meloxicam (MOBIC) 15 MG tablet Take 1 tablet (15 mg total) by mouth daily. 06/19/16   Beers, Charmayne Sheerharles M, PA-C  methocarbamol (ROBAXIN) 750 MG tablet Take 1 tablet (750 mg total) by mouth 4 (four) times daily. 06/19/16   Beers, Charmayne Sheerharles M, PA-C  nitrofurantoin, macrocrystal-monohydrate, (MACROBID) 100 MG capsule Take 1 capsule (100 mg total) by mouth 2 (two) times daily. 06/14/16   Hassan RowanWade, Eugene, MD  ondansetron (ZOFRAN ODT) 4 MG disintegrating tablet Allow 1-2 tablets to dissolve in your mouth every 8 hours as needed for nausea/vomiting 11/02/18   Loleta RoseForbach, Chealsey Miyamoto, MD  oxyCODONE-acetaminophen (ROXICET) 5-325 MG tablet Take 1-2 tablets by mouth every 4 (four) hours as needed for severe pain. 06/19/16   Evangeline DakinBeers, Charles M,  PA-C  SUMAtriptan (IMITREX) 25 MG tablet Take 25 mg by mouth every 2 (two) hours as needed for migraine. May repeat in 2 hours if headache persists or recurs.    [provider]    Allergies Patient has no known allergies.  No family history on file.  Social History Social History   Tobacco Use  . Smoking status: Never Smoker  . Smokeless tobacco: Never Used  Substance Use Topics  . Alcohol use: No  . Drug use: Yes    Types: Marijuana    Review of Systems Constitutional: Feels cold. No fever. Eyes: No  visual changes. ENT: No sore throat. Cardiovascular: Denies chest pain. Respiratory: Denies shortness of breath. Gastrointestinal: No abdominal pain.  No nausea, no vomiting.  No diarrhea.  No constipation. Genitourinary: Negative for dysuria. Musculoskeletal: Negative for neck pain and stiffness.  Negative for back pain. Integumentary: Negative for rash. Neurological: Negative for headaches, focal weakness or numbness.   ____________________________________________   PHYSICAL EXAM:  VITAL SIGNS: ED Triage Vitals  Enc Vitals Group     BP 11/01/18 2215 (!) 92/49     Pulse Rate 11/01/18 2215 73     Resp 11/01/18 2215 (!) 24     Temp 11/01/18 2215 (!) 96.3 F (35.7 C)     Temp Source 11/01/18 2215 Axillary     SpO2 11/01/18 2215 100 %     Weight 11/01/18 2213 74.8 kg (165 lb)     Height --      Head Circumference --      Peak Flow --      Pain Score --      Pain Loc --      Pain Edu? --      Excl. in GC? --     Constitutional: Alert and oriented but less interactive than usual although her mother states she is " coming around". Eyes: Conjunctivae are normal. PERRL. EOMI. Head: Atraumatic. Nose: No congestion/rhinnorhea. Mouth/Throat: Mucous membranes are moist. Neck: No stridor.  No meningeal signs.   Cardiovascular: Normal rate, regular rhythm. Good peripheral circulation. Grossly normal heart sounds. Respiratory: Normal respiratory effort.  No retractions. Lungs CTAB. Gastrointestinal: Soft and nontender. No distention.  Musculoskeletal: No lower extremity tenderness nor edema. No gross deformities of extremities. Neurologic:  Normal speech and language. No gross focal neurologic deficits are appreciated.  Skin:  Skin is cool, dry and intact. No rash noted. Psychiatric: Mood and affect are somewhat withdrawn but essentially normal and as I am talking to her and her mother she is becoming more interactive.  ____________________________________________   LABS (all  labs ordered are listed, but only abnormal results are displayed)  Labs Reviewed  COMPREHENSIVE METABOLIC PANEL - Abnormal; Notable for the following components:      Result Value   Potassium 2.8 (*)    CO2 19 (*)    Glucose, Bld 169 (*)    All other components within normal limits  LIPASE, BLOOD  CBC  ETHANOL  URINALYSIS, COMPLETE (UACMP) WITH MICROSCOPIC  URINE DRUG SCREEN, QUALITATIVE ([facility] ONLY)  POC URINE PREG, ED   ____________________________________________  EKG  None - EKG not ordered by ED physician ____________________________________________  RADIOLOGY Marylou Mccoy, personally viewed and evaluated these images (noncontrast head CT) as part of my medical decision making, as well as reviewing the written report by the radiologist.  ED MD interpretation: No acute abnormalities on head CT  Official radiology report(s): Ct Head Wo Contrast  Result Date: 11/01/2018 CLINICAL DATA:  Vomiting, headache. EXAM: CT HEAD WITHOUT CONTRAST TECHNIQUE: Contiguous axial images were obtained from the base of the skull through the vertex without intravenous contrast. COMPARISON:  None. FINDINGS: BRAIN: No intraparenchymal hemorrhage, mass effect nor midline shift. The ventricles and sulci are normal. No acute large vascular territory infarcts. No abnormal extra-axial fluid collections. Basal cisterns are patent. VASCULAR: Unremarkable. SKULL/SOFT TISSUES: No skull fracture. No significant soft tissue swelling. ORBITS/SINUSES: The included ocular globes and orbital contents are normal.Trace paranasal sinus mucosal thickening. Mastoid air cells are well aerated. OTHER: None. IMPRESSION: Normal CT HEAD without contrast. Electronically Signed   By: Awilda Metro M.D.   On: 11/01/2018 23:49    ____________________________________________   PROCEDURES  Critical Care performed: No   Procedure(s) performed:    Procedures   ____________________________________________   INITIAL IMPRESSION / ASSESSMENT AND PLAN / ED COURSE  As part of my medical decision making, I reviewed the following data within the electronic MEDICAL RECORD NUMBER History obtained from family, Nursing notes reviewed and incorporated, Labs reviewed , Old chart reviewed and Notes from prior ED visits    Differential diagnosis includes, but is not limited to, drug reaction, infection such as meningitis/encephalitis or nonspecific sepsis, PE, viral infection.  The patient is feeling much better than before and the symptoms all started after smoking marijuana with her friends.  She was not sick earlier today.  She is denying headache and neck pain right now and has had no neck stiffness.  She has no signs of meningismus.  I brought up the possibility of an LP to further rule out subarachnoid hemorrhage as well as to rule out meningitis and she and her mother are both adamantly against that and I think that is appropriate; there is no indication that she needs lumbar puncture at this time given her rapid improvement.  She has received 1 L normal saline and 4 mg of Zofran IV.  She was initially a bit hypothermic by oral temperature and I asked for reassessment.  Her rectal temperature is 95 degrees but that was when she explained that she had been outside in the cold rain for an extended period of time.  She is currently on the [employer]  Her labs are all reassuring with no leukocytosis except for her potassium of 2.8 which is likely due to her vomiting as well as her hypothermia due to intracellular shift.  I gave her potassium 40 mEq by mouth but we will not treat additionally given that as she warms up it will likely become more normal.  She is tolerating oral intake and can be discharged when she is normothermic and feeling better.  Mother and the patient are comfortable with this plan.  Clinical Course as of Nov 02 452  Sat Nov 02, 2018   0145 Patient is now normothermic and feels completely back to her baseline.  No residual symptoms.  She is acting normally and interacting appropriately with her mother and me.  Discharged with usual and customary return precautions.   [CF]    Clinical Course User Index [CF] Loleta Rose, MD    ____________________________________________  FINAL CLINICAL IMPRESSION(S) / ED DIAGNOSES  Final diagnoses:  Adverse effect of drug, initial encounter  Non-intractable vomiting with nausea, unspecified vomiting type  Hypothermia due to exposure  Hypokalemia     MEDICATIONS GIVEN DURING THIS VISIT:  Medications  ondansetron (ZOFRAN) injection 4 mg (4 mg Intravenous Given 11/01/18 2244)  sodium chloride 0.9 % bolus 1,000 mL (0 mLs Intravenous  Stopped 11/02/18 0144)  sodium chloride 0.9 % bolus 1,000 mL (0 mLs Intravenous Stopped 11/02/18 0024)  potassium chloride SA (K-DUR,KLOR-CON) CR tablet 40 mEq (40 mEq Oral Given 11/02/18 0032)     ED Discharge Orders         Ordered    ondansetron (ZOFRAN ODT) 4 MG disintegrating tablet     11/02/18 0102           Note:  This document was prepared using Dragon voice recognition software and may include unintentional dictation errors.    Loleta Rose, MD 11/02/18 [phone]

## 2018-11-02 NOTE — Discharge Instructions (Signed)
As we discussed, we believe that you had a bad reaction to the drug(s) you smoked earlier tonight.  You were very cold due to being outside in the rain but you do not seem to have any sign of infection.  We discussed a lumbar puncture but all agree that it is not necessary at this time.  Please drink plenty of fluids, avoid any other drug use, and follow-up with a primary care doctor as needed.  Return to the emergency department if you develop new or worsening symptoms that concern you.

## 2018-11-02 NOTE — ED Notes (Signed)
Bear hugger initiated to warm pt. Pt informed of procedure and verbalizes understanding.

## 2019-04-18 ENCOUNTER — Emergency Department: Payer: Self-pay

## 2019-04-18 ENCOUNTER — Emergency Department
Admission: EM | Admit: 2019-04-18 | Discharge: 2019-04-19 | Disposition: A | Discharge status: Home / Self Care | Payer: Self-pay | Attending: Emergency Medicine | Admitting: Emergency Medicine

## 2019-04-18 ENCOUNTER — Other Ambulatory Visit: Payer: Self-pay

## 2019-04-18 DIAGNOSIS — E876 Hypokalemia: Secondary | ICD-10-CM | POA: Insufficient documentation

## 2019-04-18 DIAGNOSIS — R079 Chest pain, unspecified: Secondary | ICD-10-CM

## 2019-04-18 DIAGNOSIS — N390 Urinary tract infection, site not specified: Secondary | ICD-10-CM | POA: Insufficient documentation

## 2019-04-18 DIAGNOSIS — R509 Fever, unspecified: Secondary | ICD-10-CM

## 2019-04-18 LAB — BASIC METABOLIC PANEL
Anion gap: 12 (ref 5–15)
BUN: 9 mg/dL (ref 6–20)
CO2: 19 mmol/L — ABNORMAL LOW (ref 22–32)
Calcium: 8.7 mg/dL — ABNORMAL LOW (ref 8.9–10.3)
Chloride: 103 mmol/L (ref 98–111)
Creatinine, Ser: 0.63 mg/dL (ref 0.44–1.00)
GFR calc Af Amer: >60 mL/min (ref >60)
GFR calc non Af Amer: >60 mL/min (ref >60)
Glucose, Bld: 127 mg/dL — ABNORMAL HIGH (ref 70–99)
Potassium: 3 mmol/L — ABNORMAL LOW (ref 3.5–5.1)
Sodium: 134 mmol/L — ABNORMAL LOW (ref 135–145)

## 2019-04-18 LAB — CBC
HCT: 36.7 % (ref 36.0–46.0)
Hemoglobin: 12.6 g/dL (ref 12.0–15.0)
MCH: 29.6 pg (ref 26.0–34.0)
MCHC: 34.3 g/dL (ref 30.0–36.0)
MCV: 86.2 fL (ref 80.0–100.0)
Platelets: 193 10*3/uL (ref 150–400)
RBC: 4.26 MIL/uL (ref 3.87–5.11)
RDW: 12 % (ref 11.5–15.5)
WBC: 15.9 10*3/uL — ABNORMAL HIGH (ref 4.0–10.5)
nRBC: 0 % (ref 0.0–0.2)

## 2019-04-18 LAB — HEPATIC FUNCTION PANEL
ALT: 64 U/L — ABNORMAL HIGH (ref 0–44)
AST: 43 U/L — ABNORMAL HIGH (ref 15–41)
Albumin: 3.6 g/dL (ref 3.5–5.0)
Alkaline Phosphatase: 151 U/L — ABNORMAL HIGH (ref 38–126)
Bilirubin, Direct: 0.7 mg/dL — ABNORMAL HIGH (ref 0.0–0.2)
Indirect Bilirubin: 1 mg/dL — ABNORMAL HIGH (ref 0.3–0.9)
Total Bilirubin: 1.7 mg/dL — ABNORMAL HIGH (ref 0.3–1.2)
Total Protein: 7.6 g/dL (ref 6.5–8.1)

## 2019-04-18 LAB — LIPASE, BLOOD: Lipase: 22 U/L (ref 11–51)

## 2019-04-18 LAB — TROPONIN I: Troponin I: <0.03 ng/mL (ref <0.03)

## 2019-04-18 MED ORDER — SODIUM CHLORIDE 0.9 % IV BOLUS
1000.0000 mL | Freq: Once | INTRAVENOUS | Status: AC
  Administered 2019-04-19: 1000 mL via INTRAVENOUS

## 2019-04-18 MED ORDER — ONDANSETRON HCL 4 MG/2ML IJ SOLN
4.0000 mg | Freq: Once | INTRAMUSCULAR | Status: AC
  Administered 2019-04-19: 4 mg via INTRAVENOUS
  Filled 2019-04-18: qty 2

## 2019-04-18 MED ORDER — POTASSIUM CHLORIDE CRYS ER 20 MEQ PO TBCR
40.0000 meq | EXTENDED_RELEASE_TABLET | Freq: Once | ORAL | Status: AC
  Administered 2019-04-19: 40 meq via ORAL
  Filled 2019-04-18: qty 2

## 2019-04-18 MED ORDER — SODIUM CHLORIDE 0.9% FLUSH: 3.0000 mL | Freq: Once | INTRAVENOUS | Status: DC

## 2019-04-18 NOTE — ED Notes (Signed)
X-ray at bedside

## 2019-04-18 NOTE — ED Triage Notes (Signed)
Patient c/o SOB, chest pain, N/V, generalized body aches beginning yesterday.

## 2019-04-18 NOTE — ED Provider Notes (Signed)
[facility] Emergency Department Provider Note   ____________________________________________   First MD Initiated Contact with Patient 04/18/19 2309     (approximate)  I have reviewed the triage vital signs and the nursing notes.   HISTORY  Chief Complaint Chest Pain; Shortness of Breath; and Generalized Body Aches    HPI Jeanne Pena is a 33 y.o. female who presents to the ED from home with a one day history of fever, body aches, sore throat, runny nose, n/v/d, cp and sob. Works at [employer]. No sick contacts at home. Denies abdominal pain, dysuria, recent travel or trauma.       Past Medical History:  Diagnosis Date  . Migraine     There are no active problems to display for this patient.   Past Surgical History:  Procedure Laterality Date  . ABDOMINAL SURGERY    . CESAREAN SECTION    . CHOLECYSTECTOMY    . LAPAROSCOPIC OOPHERECTOMY     l    Prior to Admission medications   Medication Sig Start Date End Date Taking? Authorizing Provider  brompheniramine-pseudoephedrine-DM 30-2-10 MG/5ML syrup Take 10 mLs by mouth 4 (four) times daily as needed. 01/11/16   Hagler, Jami L, PA-C  fluticasone (FLONASE) 50 MCG/ACT nasal spray Place 2 sprays into both nostrils daily. 01/11/16   Hagler, Jami L, PA-C  meloxicam (MOBIC) 15 MG tablet Take 1 tablet (15 mg total) by mouth daily. 06/19/16   Beers, Charmayne Sheer, PA-C  methocarbamol (ROBAXIN) 750 MG tablet Take 1 tablet (750 mg total) by mouth 4 (four) times daily. 06/19/16   Beers, Charmayne Sheer, PA-C  nitrofurantoin, macrocrystal-monohydrate, (MACROBID) 100 MG capsule Take 1 capsule (100 mg total) by mouth 2 (two) times daily. 06/14/16   Hassan Rowan, MD  ondansetron (ZOFRAN ODT) 4 MG disintegrating tablet Allow 1-2 tablets to dissolve in your mouth every 8 hours as needed for nausea/vomiting 11/02/18   Loleta Rose, MD  oxyCODONE-acetaminophen (ROXICET) 5-325 MG tablet Take 1-2 tablets by mouth every 4 (four)  hours as needed for severe pain. 06/19/16   Beers, Charmayne Sheer, PA-C  SUMAtriptan (IMITREX) 25 MG tablet Take 25 mg by mouth every 2 (two) hours as needed for migraine. May repeat in 2 hours if headache persists or recurs.    [provider]    Allergies Patient has no known allergies.  No family history on file.  Social History Social History   Tobacco Use  . Smoking status: Never Smoker  . Smokeless tobacco: Never Used  Substance Use Topics  . Alcohol use: No  . Drug use: Yes    Types: Marijuana    Review of Systems  Constitutional: Positive for fever and myalgias. Eyes: No visual changes. ENT: Positive for runny nose and sore throat. Cardiovascular: Positive for chest pain. Respiratory: Positive for cough and shortness of breath. Gastrointestinal: No abdominal pain.  Positive for nausea, vomiting and diarrhea.  No constipation. Genitourinary: Negative for dysuria. Musculoskeletal: Negative for back pain. Skin: Negative for rash. Neurological: Negative for headaches, focal weakness or numbness.   ____________________________________________   PHYSICAL EXAM:  VITAL SIGNS: ED Triage Vitals  Enc Vitals Group     BP 04/18/19 2030 115/67     Pulse Rate 04/18/19 2030 (!) 101     Resp 04/18/19 2030 18     Temp 04/18/19 2030 99.4 F (37.4 C)     Temp Source 04/18/19 2030 Oral     SpO2 04/18/19 2030 99 %  Weight 04/18/19 2031 180 lb (81.6 kg)     Height 04/18/19 2031 5\' 2"  (1.575 m)     Head Circumference --      Peak Flow --      Pain Score 04/18/19 2031 5     Pain Loc --      Pain Edu? --      Excl. in GC? --     Constitutional: Alert and oriented. Well appearing and in mild acute distress. Eyes: Conjunctivae are normal. PERRL. EOMI. Head: Atraumatic. Nose: Congestion/rhinnorhea. Mouth/Throat: Mucous membranes are moist.  Oropharynx minimally erythematous without tonsillar swelling, exudates or peritonsillar abscess. Neck: No stridor.  Supple neck  without meningismus. Hematological/Lymphatic/Immunilogical: No cervical lymphadenopathy. Cardiovascular: Normal rate, regular rhythm. Grossly normal heart sounds.  Good peripheral circulation. Respiratory: Normal respiratory effort.  No retractions. Lungs CTAB. Gastrointestinal: Soft and nontender to light or deep palpation. No distention. No abdominal bruits. No CVA tenderness. Musculoskeletal: No lower extremity tenderness nor edema.  No joint effusions. Neurologic:  Normal speech and language. No gross focal neurologic deficits are appreciated. No gait instability. Skin:  Skin is warm, dry and intact. No rash noted. No petechiae. Psychiatric: Mood and affect are normal. Speech and behavior are normal.  ____________________________________________   LABS (all labs ordered are listed, but only abnormal results are displayed)  Labs Reviewed  BASIC METABOLIC PANEL - Abnormal; Notable for the following components:      Result Value   Sodium 134 (*)    Potassium 3.0 (*)    CO2 19 (*)    Glucose, Bld 127 (*)    Calcium 8.7 (*)    All other components within normal limits  CBC - Abnormal; Notable for the following components:   WBC 15.9 (*)    All other components within normal limits  HEPATIC FUNCTION PANEL - Abnormal; Notable for the following components:   AST 43 (*)    ALT 64 (*)    Alkaline Phosphatase 151 (*)    Total Bilirubin 1.7 (*)    Bilirubin, Direct 0.7 (*)    Indirect Bilirubin 1.0 (*)    All other components within normal limits  URINALYSIS, COMPLETE (UACMP) WITH MICROSCOPIC - Abnormal; Notable for the following components:   Color, Urine AMBER (*)    APPearance CLOUDY (*)    Ketones, ur 80 (*)    Protein, ur 100 (*)    Nitrite POSITIVE (*)    Leukocytes,Ua MODERATE (*)    WBC, UA >50 (*)    All other components within normal limits  LACTIC ACID, PLASMA - Abnormal; Notable for the following components:   Lactic Acid, Venous 2.3 (*)    All other components  within normal limits  CULTURE, BLOOD (ROUTINE X 2)  CULTURE, BLOOD (ROUTINE X 2)  URINE CULTURE  TROPONIN I  LIPASE, BLOOD  PREGNANCY, URINE  LACTIC ACID, PLASMA   ____________________________________________  EKG  ED ECG REPORT I, [place] Kiel[place]a J, the attending physician, personally viewed and interpreted this ECG.   Date: 04/19/2019  EKG Time: 2036  Rate: 95  Rhythm: normal EKG, normal sinus rhythm  Axis: Normal  Intervals:none  ST&T Change: Nonspecific ____________________________________________  RADIOLOGY  ED MD interpretation:  No acute cardiopulmonary process  Official radiology report(s): Dg Chest Port 1 View  Result Date: 04/18/2019 CLINICAL DATA:  Chest pain and fever beginning yesterday. Nausea and vomiting. EXAM: PORTABLE CHEST 1 VIEW COMPARISON:  07/24/2012 FINDINGS: The heart size and mediastinal contours are within normal limits. Both lungs are clear.  The visualized skeletal structures are unremarkable. IMPRESSION: No active disease. Electronically Signed   By: Myles Rosenthal M.D.   On: 04/18/2019 23:20    ____________________________________________   PROCEDURES  Procedure(s) performed (including Critical Care):  Procedures   ____________________________________________   INITIAL IMPRESSION / ASSESSMENT AND PLAN / ED COURSE  As part of my medical decision making, I reviewed the following data within the electronic MEDICAL RECORD NUMBER Nursing notes reviewed and incorporated, Labs reviewed, Old chart reviewed, Radiograph reviewed and Notes from prior ED visits     Jeanne Pena was evaluated in Emergency Department on 04/19/2019 for the symptoms described in the history of present illness. She was evaluated in the context of the global COVID-19 pandemic, which necessitated consideration that the patient might be at risk for infection with the SARS-CoV-2 virus that causes COVID-19. Institutional protocols and algorithms that pertain to the evaluation of  patients at risk for COVID-19 are in a state of rapid change based on information released by regulatory bodies including the CDC and federal and state organizations. These policies and algorithms were followed during the patient's care in the ED.   33 year old female who presents with fever, cough, cp, sob, n/v/d. Differential diagnosis includes but is not exclusive to viral syndrome, Covid19, gastroenteritis, CAP, etc.  Will initiate IV fluid resuscitation, IV antiemetic, replete KCL, check Covid and reassess.  Clinical Course as of Apr 18 522  Sat Apr 19, 2019  0145 Updated patient on urine results. She would like to cancel Covid testing. States this is her typical presentation with UTI. Re-examined abdomen and flanks which remain nontender to palpation. No history of kidney stones. Elevated LFTs most likely secondary to vomiting.   [JS]  0321 Offered patient admission upon noting elevated lactate. Patient states she was feeling significantly better and prefers discharge. Will repeat lactate after IV fluids.   [JS]  0521 Lactate normalized after IV fluids. Patient tolerated PO without emesis. Feels significantly better and eager for discharge home. Will discharge with Keflex, Zofran as needed and she will follow up with her PCP next week. Strict return precautions given. Patient verbalizes understanding and agrees with plan of care.   [JS]    Clinical Course User Index [JS] Irean Hong, MD     ____________________________________________   FINAL CLINICAL IMPRESSION(S) / ED DIAGNOSES  Final diagnoses:  Hypokalemia  Fever, unspecified fever cause  Urinary tract infection without hematuria, site unspecified     ED Discharge Orders    None       Note:  This document was prepared using Dragon voice recognition software and may include unintentional dictation errors.   Irean Hong, MD 04/19/19 [phone]

## 2019-04-18 NOTE — ED Notes (Signed)
Pt reports fever since yesterday today highest 103. F, body aches, sore throat, runny nose, nausea, vomiting last time vomited around 18:00, denies any body else sick at home. Reports she works at [employer].

## 2019-04-19 LAB — URINALYSIS, COMPLETE (UACMP) WITH MICROSCOPIC
Bacteria, UA: NONE SEEN
Bilirubin Urine: NEGATIVE
Glucose, UA: NEGATIVE mg/dL
Hgb urine dipstick: NEGATIVE
Ketones, ur: 80 mg/dL — AB
Nitrite: POSITIVE — AB
Protein, ur: 100 mg/dL — AB
Specific Gravity, Urine: 1.024 (ref 1.005–1.030)
WBC, UA: >50 WBC/hpf — ABNORMAL HIGH (ref 0–5)
pH: 6 (ref 5.0–8.0)

## 2019-04-19 LAB — PREGNANCY, URINE: Preg Test, Ur: NEGATIVE

## 2019-04-19 LAB — BLOOD CULTURE ID PANEL (REFLEXED)

## 2019-04-19 LAB — LACTIC ACID, PLASMA
Lactic Acid, Venous: 2.3 mmol/L (ref 0.5–1.9)
Lactic Acid, Venous: 0.5 mmol/L (ref 0.5–1.9)

## 2019-04-19 MED ORDER — SODIUM CHLORIDE 0.9 % IV BOLUS (SEPSIS)
500.0000 mL | Freq: Once | INTRAVENOUS | Status: AC
  Administered 2019-04-19: 500 mL via INTRAVENOUS

## 2019-04-19 MED ORDER — ONDANSETRON 4 MG PO TBDP: 4.0000 mg | ORAL_TABLET | Freq: Three times a day (TID) | ORAL | 0 refills | Status: DC | PRN

## 2019-04-19 MED ORDER — SODIUM CHLORIDE 0.9 % IV BOLUS (SEPSIS)
1000.0000 mL | Freq: Once | INTRAVENOUS | Status: AC
  Administered 2019-04-19: 1000 mL via INTRAVENOUS

## 2019-04-19 MED ORDER — CEPHALEXIN 500 MG PO CAPS: 500.0000 mg | ORAL_CAPSULE | Freq: Three times a day (TID) | ORAL | 0 refills | Status: DC

## 2019-04-19 MED ORDER — ACETAMINOPHEN 500 MG PO TABS
1000.0000 mg | ORAL_TABLET | Freq: Once | ORAL | Status: AC
  Administered 2019-04-19: 1000 mg via ORAL
  Filled 2019-04-19: qty 2

## 2019-04-19 MED ORDER — ONDANSETRON HCL 4 MG/2ML IJ SOLN
4.0000 mg | Freq: Once | INTRAMUSCULAR | Status: AC
  Administered 2019-04-19: 4 mg via INTRAVENOUS
  Filled 2019-04-19: qty 2

## 2019-04-19 MED ORDER — SODIUM CHLORIDE 0.9 % IV SOLN
1.0000 g | INTRAVENOUS | Status: DC
  Administered 2019-04-19: 1 g via INTRAVENOUS
  Filled 2019-04-19: qty 10

## 2019-04-19 NOTE — ED Notes (Signed)
IV attempted x 2 by this RN. 

## 2019-04-19 NOTE — ED Notes (Signed)
Per MD Dolores Frame, pt is ok to wait on urine results before swabbing.

## 2019-04-19 NOTE — ED Notes (Signed)
Pt states that she is no longer nauseated and is feeling better at this time.

## 2019-04-19 NOTE — ED Provider Notes (Signed)
-----------------------------------------   11:17 PM on 04/19/2019 -----------------------------------------  Notified 1 of patient's blood cultures is positive for gram-positive cocci.  Charge nurse Lea to call patient for patient to come back for admission.   Irean Hong, MD 04/19/19 [phone]

## 2019-04-19 NOTE — Progress Notes (Signed)
CODE SEPSIS - PHARMACY COMMUNICATION  **Broad Spectrum Antibiotics should be administered within 1 hour of Sepsis diagnosis**  Time Code Sepsis Called/Page Received: @ 0127  Antibiotics Ordered: ceftriaxone   Time of 1st antibiotic administration: @ 0207  Additional action taken by pharmacy: N/A  If necessary, Name of Provider/Nurse Contacted: N/A  Gardner Candle, PharmD, BCPS Clinical Pharmacist 04/19/2019 2:10 AM

## 2019-04-19 NOTE — ED Notes (Signed)
MD Sung aware of lactic acid result.  

## 2019-04-19 NOTE — Discharge Instructions (Addendum)
Take antibiotic as prescribed (Keflex 500mg  three times daily x 7 days). You may take Zofran as needed for nausea. Alternate Tylenol and Ibuprofen every 4 hours as needed for fever > 100.4 Return to the ER for worsening symptoms, persistent vomiting, difficulty breathing or other concerns.

## 2019-04-21 ENCOUNTER — Telehealth: Payer: Self-pay | Admitting: Emergency Medicine

## 2019-04-21 LAB — URINE CULTURE
Culture: >=100000 — AB
Special Requests: NORMAL

## 2019-04-21 NOTE — Telephone Encounter (Signed)
The patient called me back.  She says she is feeling much better.  ;no vomiting now.  I explained positive culture, but that it can be contaminate.  She agrees to return if she starts feeling worse.  She does not have a pcp.  She is going to try to get one at [facility]. I told her that she can be seen for ED follow up at [facility].

## 2019-04-21 NOTE — Telephone Encounter (Signed)
Called patient due to positive blood culture.  Not sure if charge nurse contacted her 2 days ago.  I left a message with my number to call me and told her to come back if needed.

## 2019-04-22 LAB — CULTURE, BLOOD (ROUTINE X 2): Special Requests: ADEQUATE

## 2020-01-22 ENCOUNTER — Encounter: Payer: Self-pay | Admitting: Intensive Care

## 2020-01-22 ENCOUNTER — Other Ambulatory Visit: Payer: Self-pay

## 2020-01-22 ENCOUNTER — Emergency Department: Payer: Self-pay

## 2020-01-22 ENCOUNTER — Emergency Department
Admission: EM | Admit: 2020-01-22 | Discharge: 2020-01-22 | Disposition: A | Discharge status: Home / Self Care | Payer: Self-pay | Attending: Emergency Medicine | Admitting: Emergency Medicine

## 2020-01-22 DIAGNOSIS — X509XXA Other and unspecified overexertion or strenuous movements or postures, initial encounter: Secondary | ICD-10-CM | POA: Insufficient documentation

## 2020-01-22 DIAGNOSIS — F1210 Cannabis abuse, uncomplicated: Secondary | ICD-10-CM | POA: Insufficient documentation

## 2020-01-22 DIAGNOSIS — T148XXA Other injury of unspecified body region, initial encounter: Secondary | ICD-10-CM

## 2020-01-22 DIAGNOSIS — R937 Abnormal findings on diagnostic imaging of other parts of musculoskeletal system: Secondary | ICD-10-CM | POA: Insufficient documentation

## 2020-01-22 DIAGNOSIS — Y998 Other external cause status: Secondary | ICD-10-CM | POA: Insufficient documentation

## 2020-01-22 DIAGNOSIS — S93601A Unspecified sprain of right foot, initial encounter: Secondary | ICD-10-CM | POA: Insufficient documentation

## 2020-01-22 DIAGNOSIS — Y9389 Activity, other specified: Secondary | ICD-10-CM | POA: Insufficient documentation

## 2020-01-22 DIAGNOSIS — R52 Pain, unspecified: Secondary | ICD-10-CM

## 2020-01-22 DIAGNOSIS — Y929 Unspecified place or not applicable: Secondary | ICD-10-CM | POA: Insufficient documentation

## 2020-01-22 MED ORDER — KETOROLAC TROMETHAMINE 30 MG/ML IJ SOLN
30.0000 mg | Freq: Once | INTRAMUSCULAR | Status: AC
  Administered 2020-01-22: 30 mg via INTRAMUSCULAR
  Filled 2020-01-22: qty 1

## 2020-01-22 MED ORDER — IBUPROFEN 600 MG PO TABS: 600.0000 mg | ORAL_TABLET | Freq: Four times a day (QID) | ORAL | 0 refills | Status: AC | PRN

## 2020-01-22 NOTE — ED Triage Notes (Signed)
Twisted right ankle yesterday afternoon

## 2020-01-22 NOTE — ED Provider Notes (Signed)
[facility] Emergency Department Provider Note  ____________________________________________  Time seen: Approximately 10:23 AM  I have reviewed the triage vital signs and the nursing notes.   HISTORY  Chief Complaint Foot Pain (right)    HPI Jeanne Pena is a 34 y.o. female that presents to the emergency department for evaluation of right lateral foot and ankle pain after injury yesterday.  Patient states that she was on the commode yesterday at work and when she got up, she rolled her ankle.  Her ankle went inward.  She continued to walk on foot all day.  This morning, she had difficulty bearing weight on her foot and could not go to work so she came to the emergency department to be evaluated.  No additional injuries.  Past Medical History:  Diagnosis Date  . Migraine     There are no problems to display for this patient.   Past Surgical History:  Procedure Laterality Date  . ABDOMINAL SURGERY    . CESAREAN SECTION    . CHOLECYSTECTOMY    . LAPAROSCOPIC OOPHERECTOMY     l    Prior to Admission medications   Medication Sig Start Date End Date Taking? Authorizing Provider  brompheniramine-pseudoephedrine-DM 30-2-10 MG/5ML syrup Take 10 mLs by mouth 4 (four) times daily as needed. 01/11/16   Hagler, Jami L, PA-C  cephALEXin (KEFLEX) 500 MG capsule Take 1 capsule (500 mg total) by mouth 3 (three) times daily. 04/19/19   Irean Hong, MD  fluticasone (FLONASE) 50 MCG/ACT nasal spray Place 2 sprays into both nostrils daily. 01/11/16   Hagler, Jami L, PA-C  ibuprofen (ADVIL) 600 MG tablet Take 1 tablet (600 mg total) by mouth every 6 (six) hours as needed. 01/22/20   Enid Derry, PA-C  meloxicam (MOBIC) 15 MG tablet Take 1 tablet (15 mg total) by mouth daily. 06/19/16   Beers, Charmayne Sheer, PA-C  methocarbamol (ROBAXIN) 750 MG tablet Take 1 tablet (750 mg total) by mouth 4 (four) times daily. 06/19/16   Beers, Charmayne Sheer, PA-C  nitrofurantoin,  macrocrystal-monohydrate, (MACROBID) 100 MG capsule Take 1 capsule (100 mg total) by mouth 2 (two) times daily. 06/14/16   Hassan Rowan, MD  ondansetron (ZOFRAN ODT) 4 MG disintegrating tablet Take 1 tablet (4 mg total) by mouth every 8 (eight) hours as needed for nausea or vomiting. 04/19/19   Irean Hong, MD  oxyCODONE-acetaminophen (ROXICET) 5-325 MG tablet Take 1-2 tablets by mouth every 4 (four) hours as needed for severe pain. 06/19/16   Beers, Charmayne Sheer, PA-C  SUMAtriptan (IMITREX) 25 MG tablet Take 25 mg by mouth every 2 (two) hours as needed for migraine. May repeat in 2 hours if headache persists or recurs.    [provider]    Allergies Patient has no known allergies.  History reviewed. No pertinent family history.  Social History Social History   Tobacco Use  . Smoking status: Never Smoker  . Smokeless tobacco: Never Used  Substance Use Topics  . Alcohol use: Yes    Comment: socially  . Drug use: Yes    Types: Marijuana     Review of Systems  Gastrointestinal: No nausea, no vomiting.  Musculoskeletal: Positive for foot and ankle pain. Skin: Negative for rash, abrasions, lacerations.  Positive for ecchymosis. Neurological: Negative for headaches, numbness or tingling   ____________________________________________   PHYSICAL EXAM:  VITAL SIGNS: ED Triage Vitals  Enc Vitals Group     BP 01/22/20 0906 111/73  Pulse Rate 01/22/20 0906 83     Resp 01/22/20 0906 18     Temp 01/22/20 0906 97.7 F (36.5 C)     Temp Source 01/22/20 0906 Oral     SpO2 01/22/20 0906 100 %     Weight 01/22/20 0902 189 lb (85.7 kg)     Height 01/22/20 0902 5\' 3"  (1.6 m)     Head Circumference --      Peak Flow --      Pain Score 01/22/20 0902 8     Pain Loc --      Pain Edu? --      Excl. in GC? --      Constitutional: Alert and oriented. Well appearing and in no acute distress. Eyes: Conjunctivae are normal. PERRL. EOMI. Head: Atraumatic. ENT:      Ears:       Nose: No congestion/rhinnorhea.      Mouth/Throat: Mucous membranes are moist.  Neck: No stridor. Cardiovascular: Normal rate, regular rhythm.  Good peripheral circulation. Symmetric pedal pulses bilaterally.  Respiratory: Normal respiratory effort without tachypnea or retractions. Lungs CTAB. Good air entry to the bases with no decreased or absent breath sounds. Musculoskeletal: Full range of motion to all extremities. No gross deformities appreciated. Neurologic:  Normal speech and language. No gross focal neurologic deficits are appreciated.  Skin:  Skin is warm, dry. Ecchymosis and mild swelling to lateral foot and ankle.  Psychiatric: Mood and affect are normal. Speech and behavior are normal. Patient exhibits appropriate insight and judgement.   ____________________________________________   LABS (all labs ordered are listed, but only abnormal results are displayed)  Labs Reviewed - No data to display ____________________________________________  EKG   ____________________________________________  RADIOLOGY 03/23/20, personally viewed and evaluated these images (plain radiographs) as part of my medical decision making, as well as reviewing the written report by the radiologist.  DG Ankle Complete Right  Result Date: 01/22/2020 CLINICAL DATA:  Right lateral malleolus swelling, bruising after fall and twisted ankle EXAM: RIGHT ANKLE - COMPLETE 3+ VIEW COMPARISON:  None FINDINGS: There is no evidence of arthropathy or other focal bone abnormality. Mild soft tissue swelling about the medial aspect of the right ankle. This is below the lateral malleolus seen best on AP view. Subtle bone fragments seen superior to the navicular bone on the lateral view near the talonavicular articulation. There is also some irregularity of the superior aspect of the navicular bone seen only on the lateral view. IMPRESSION: Possible navicular avulsion fracture best seen on lateral view. Correlate  with any point tenderness along the dorsum of the ankle. Electronically Signed   By: 03/23/2020 M.D.   On: 01/22/2020 10:05    ____________________________________________    PROCEDURES  Procedure(s) performed:    Procedures    Medications  ketorolac (TORADOL) 30 MG/ML injection 30 mg (30 mg Intramuscular Given 01/22/20 1033)     ____________________________________________   INITIAL IMPRESSION / ASSESSMENT AND PLAN / ED COURSE  Pertinent labs & imaging results that were available during my care of the patient were reviewed by me and considered in my medical decision making (see chart for details).  Review of the [place] CSRS was performed in accordance of the [facility] prior to dispensing any controlled drugs.   Patient's diagnosis is consistent with split foot sprain and possible avulsion fracture.  Vital signs and exam are reassuring.  X-ray consistent with possible avulsion fracture.  Stirrup ankle splint and postop shoe were placed.  Crutches  were given.  IM Toradol was given for pain.  Patient will be discharged home with prescriptions for Motrin. Patient is to follow up with podiatry as directed. Patient is given ED precautions to return to the ED for any worsening or new symptoms.  JUMANA PACCIONE was evaluated in Emergency Department on 01/22/2020 for the symptoms described in the history of present illness. She was evaluated in the context of the global COVID-19 pandemic, which necessitated consideration that the patient might be at risk for infection with the SARS-CoV-2 virus that causes COVID-19. Institutional protocols and algorithms that pertain to the evaluation of patients at risk for COVID-19 are in a state of rapid change based on information released by regulatory bodies including the CDC and federal and state organizations. These policies and algorithms were followed during the patient's care in the ED.   ____________________________________________  FINAL CLINICAL  IMPRESSION(S) / ED DIAGNOSES  Final diagnoses:  Avulsion fracture  Sprain of right foot, initial encounter      NEW MEDICATIONS STARTED DURING THIS VISIT:  ED Discharge Orders         Ordered    ibuprofen (ADVIL) 600 MG tablet  Every 6 hours PRN     01/22/20 1055              This chart was dictated using voice recognition software/Dragon. Despite best efforts to proofread, errors can occur which can change the meaning. Any change was purely unintentional.    Laban Emperor, PA-C 01/22/20 1314    Lavonia Drafts, MD 01/22/20 1407

## 2020-01-22 NOTE — Discharge Instructions (Signed)
Your x-ray shows that you may have a small fracture in your foot.  Please wear the ankle brace and postop shoe.  Use crutches.  Please call podiatry today for a follow-up appointment.

## 2021-01-10 ENCOUNTER — Other Ambulatory Visit: Payer: Self-pay

## 2021-01-11 ENCOUNTER — Ambulatory Visit (LOCAL_COMMUNITY_HEALTH_CENTER): Payer: BLUE CROSS/BLUE SHIELD

## 2021-01-11 ENCOUNTER — Other Ambulatory Visit: Payer: Self-pay

## 2021-01-11 DIAGNOSIS — Z111 Encounter for screening for respiratory tuberculosis: Secondary | ICD-10-CM

## 2021-01-14 ENCOUNTER — Ambulatory Visit (LOCAL_COMMUNITY_HEALTH_CENTER): Payer: BLUE CROSS/BLUE SHIELD

## 2021-01-14 ENCOUNTER — Other Ambulatory Visit: Payer: Self-pay

## 2021-01-14 DIAGNOSIS — Z111 Encounter for screening for respiratory tuberculosis: Secondary | ICD-10-CM

## 2021-01-14 LAB — TB SKIN TEST
Induration: 0 mm
TB Skin Test: NEGATIVE

## 2022-02-22 ENCOUNTER — Other Ambulatory Visit: Payer: Self-pay

## 2022-02-22 ENCOUNTER — Ambulatory Visit
Admission: EM | Admit: 2022-02-22 | Discharge: 2022-02-22 | Disposition: A | Discharge status: Home / Self Care | Payer: Self-pay

## 2022-02-22 ENCOUNTER — Encounter: Payer: Self-pay | Admitting: Emergency Medicine

## 2022-02-22 DIAGNOSIS — M542 Cervicalgia: Secondary | ICD-10-CM

## 2022-02-22 DIAGNOSIS — M549 Dorsalgia, unspecified: Secondary | ICD-10-CM
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
## 2022-02-22 NOTE — Discharge Instructions (Signed)
NECK PAIN: Stressed avoiding painful activities. This can exacerbate your symptoms and make them worse.  May apply heat to the areas of pain for some relief. Use medications as directed. Be aware of which medications make you drowsy and do not drive or operate any kind of heavy machinery while using the medication (ie pain medications or muscle relaxers). F/U with PCP for reexamination or return sooner if condition worsens or does not begin to improve over the next few days.  ? ?NECK PAIN RED FLAGS: If symptoms get worse than they are right now, you should come back sooner for re-evaluation. If you have increased numbness/ tingling or notice that the numbness/tingling is affecting the legs or saddle region, go to ER. If you ever lose continence go to ER.     ? ?BACK PAIN: Stressed avoiding painful activities . RICE (REST, ICE, COMPRESSION, ELEVATION) guidelines reviewed. May alternate ice and heat. Consider use of muscle rubs, Salonpas patches, etc. Use medications as directed including muscle relaxers if prescribed. Take anti-inflammatory medications as prescribed or OTC NSAIDs/Tylenol.  F/u with PCP in 7-10 days for reexamination, and please feel free to call or return to the urgent care at any time for any questions or concerns you may have and we will be happy to help you!  ? ?BACK PAIN RED FLAGS: If the back pain acutely worsens or there are any red flag symptoms such as numbness/tingling, leg weakness, saddle anesthesia, or loss of bowel/bladder control, go immediately to the ER. Follow up with us as scheduled or sooner if the pain does not begin to resolve or if it worsens before the follow up   ?
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
## 2022-03-18 ENCOUNTER — Ambulatory Visit
Admission: EM | Admit: 2022-03-18 | Discharge: 2022-03-18 | Disposition: A | Discharge status: Home / Self Care | Payer: Self-pay | Attending: Physician Assistant | Admitting: Physician Assistant

## 2022-03-18 DIAGNOSIS — R051 Acute cough: Secondary | ICD-10-CM

## 2022-03-18 DIAGNOSIS — R0981 Nasal congestion: Secondary | ICD-10-CM

## 2022-03-18 DIAGNOSIS — J069 Acute upper respiratory infection, unspecified: Secondary | ICD-10-CM

## 2022-03-18 MED ORDER — IPRATROPIUM BROMIDE 0.06 % NA SOLN
2.0000 | Freq: Four times a day (QID) | NASAL | 12 refills | Status: AC
Start: 2022-03-18 — End: ?

## 2022-03-18 MED ORDER — PSEUDOEPH-BROMPHEN-DM 30-2-10 MG/5ML PO SYRP: 10.0000 mL | ORAL_SOLUTION | Freq: Four times a day (QID) | ORAL | 0 refills | Status: AC | PRN

## 2022-03-18 NOTE — Discharge Instructions (Addendum)
URI/COLD SYMPTOMS: Your exam today is consistent with a viral illness. Antibiotics are not indicated at this time. Use medications as directed, including cough syrup, nasal saline, and decongestants. Your symptoms should improve over the next few days and resolve within another 7-10 days. Increase rest and fluids. F/u if symptoms worsen or predominate such as sore throat, ear pain, productive cough, shortness of breath, or if you develop high fevers or worsening fatigue over the next several days.   ? ?-Use GoodRx for prescriptions. No need to get allergy meds if you use this cough medicine since it has an antihistamine. ?

## 2022-03-18 NOTE — ED Provider Notes (Signed)
?[facility] ? ? ? ?CSN: [id number] ?Arrival date & time: 03/18/22  4098 ? ? ?  ? ?History   ?Chief Complaint ?Chief Complaint  ?Patient presents with  ? Cough  ? Nasal Congestion  ? ? ?HPI ?Jeanne Pena is a 36 y.o. female presenting for 1 week history of cough that has become productive of yellowish sputum over the past 3 days, nasal congestion and postnasal drainage.  Denies any associated fever, fatigue, aches, sinus pain, chest pain or breathing difficulty.  Patient says her nasal drainage is clear.  Patient reports sometimes being kept up at night by her cough.  States no one else in her household is sick.  She does have some allergies but has tried over-the-counter allergy medicine as well as Tylenol cold and sinus without improvement in symptoms.  Reports that she believes her cough is worsening.  No other complaints. ? ?HPI ? ?Past Medical History:  ?Diagnosis Date  ? Migraine   ? ? ?There are no problems to display for this patient. ? ? ?Past Surgical History:  ?Procedure Laterality Date  ? ABDOMINAL SURGERY    ? CESAREAN SECTION    ? CHOLECYSTECTOMY    ? LAPAROSCOPIC OOPHERECTOMY    ? l  ? ? ?OB History   ? ? Gravida  ?4  ? Para  ?0  ? Term  ?0  ? Preterm  ?0  ? AB  ?2  ? Living  ?   ?  ? ? SAB  ?2  ? IAB  ?0  ? Ectopic  ?0  ? Multiple  ?   ? Live Births  ?   ?   ?  ?  ? ? ? ?Home Medications   ? ?Prior to Admission medications   ?Medication Sig Start Date End Date Taking? Authorizing Provider  ?amitriptyline (ELAVIL) 10 MG tablet amitriptyline 10 mg tablet 11/18/19  Yes [provider]  ?brompheniramine-pseudoephedrine-DM 30-2-10 MG/5ML syrup Take 10 mLs by mouth 4 (four) times daily as needed for up to 7 days. 03/18/22 03/25/22 Yes Eusebio Friendly B, PA-C  ?ibuprofen (ADVIL) 600 MG tablet Take 1 tablet (600 mg total) by mouth every 6 (six) hours as needed. 01/22/20  Yes Enid Derry, PA-C  ?ipratropium (ATROVENT) 0.06 % nasal spray Place 2 sprays into both nostrils 4 (four) times  daily. 03/18/22  Yes Shirlee Latch, PA-C  ?levonorgestrel (MIRENA) 20 MCG/DAY IUD by Intrauterine route.   Yes [provider]  ?SUMAtriptan (IMITREX) 25 MG tablet Take 25 mg by mouth every 2 (two) hours as needed for migraine. May repeat in 2 hours if headache persists or recurs.   Yes [provider]  ?tiZANidine (ZANAFLEX) 4 MG tablet tizanidine 4 mg tablet 01/24/22  Yes [provider]  ? ? ?Family History ?History reviewed. No pertinent family history. ? ?Social History ?Social History  ? ?Tobacco Use  ? Smoking status: Never  ? Smokeless tobacco: Never  ?Vaping Use  ? Vaping Use: Never used  ?Substance Use Topics  ? Alcohol use: Yes  ?  Comment: socially  ? Drug use: Yes  ?  Types: Marijuana  ? ? ? ?Allergies   ?Patient has no known allergies. ? ? ?Review of Systems ?Review of Systems  ?Constitutional:  Negative for chills, diaphoresis, fatigue and fever.  ?HENT:  Positive for congestion, postnasal drip and rhinorrhea. Negative for ear pain, sinus pressure, sinus pain and sore throat.   ?Respiratory:  Positive for cough. Negative for shortness of  breath and wheezing.   ?Cardiovascular:  Negative for chest pain.  ?Gastrointestinal:  Negative for abdominal pain, nausea and vomiting.  ?Musculoskeletal:  Negative for arthralgias and myalgias.  ?Skin:  Negative for rash.  ?Neurological:  Negative for weakness and headaches.  ?Hematological:  Negative for adenopathy.  ? ? ?Physical Exam ?Triage Vital Signs ?ED Triage Vitals  ?Enc Vitals Group  ?   BP   ?   Pulse   ?   Resp   ?   Temp   ?   Temp src   ?   SpO2   ?   Weight   ?   Height   ?   Head Circumference   ?   Peak Flow   ?   Pain Score   ?   Pain Loc   ?   Pain Edu?   ?   Excl. in GC?   ? ?No data found. ? ?Updated Vital Signs ?BP 132/89 (BP Location: Left Arm)   Pulse 78   Temp 98.2 ?F (36.8 ?C) (Oral)   Resp 18   Ht 5\' 3"  (1.6 m)   Wt 180 lb (81.6 kg)   SpO2 100%   BMI 31.89 kg/m?  ? ?Physical Exam ?Vitals and nursing note  reviewed.  ?Constitutional:   ?   General: She is not in acute distress. ?   Appearance: Normal appearance. She is ill-appearing (mildly). She is not toxic-appearing.  ?HENT:  ?   Head: Normocephalic and atraumatic.  ?   Nose: Congestion present.  ?   Mouth/Throat:  ?   Mouth: Mucous membranes are moist.  ?   Pharynx: Oropharynx is clear. Posterior oropharyngeal erythema (mild with PND) present.  ?Eyes:  ?   General: No scleral icterus.    ?   Right eye: No discharge.     ?   Left eye: No discharge.  ?   Conjunctiva/sclera: Conjunctivae normal.  ?Cardiovascular:  ?   Rate and Rhythm: Normal rate and regular rhythm.  ?   Heart sounds: Normal heart sounds.  ?Pulmonary:  ?   Effort: Pulmonary effort is normal. No respiratory distress.  ?   Breath sounds: Normal breath sounds. No wheezing, rhonchi or rales.  ?Musculoskeletal:  ?   Cervical back: Neck supple.  ?Skin: ?   General: Skin is dry.  ?Neurological:  ?   General: No focal deficit present.  ?   Mental Status: She is alert. Mental status is at baseline.  ?   Motor: No weakness.  ?   Gait: Gait normal.  ?Psychiatric:     ?   Mood and Affect: Mood normal.     ?   Behavior: Behavior normal.     ?   Thought Content: Thought content normal.  ? ? ? ?UC Treatments / Results  ?Labs ?(all labs ordered are listed, but only abnormal results are displayed) ?Labs Reviewed - No data to display ? ?EKG ? ? ?Radiology ?No results found. ? ?Procedures ?Procedures (including critical care time) ? ?Medications Ordered in UC ?Medications - No data to display ? ?Initial Impression / Assessment and Plan / UC Course  ?I have reviewed the triage vital signs and the nursing notes. ? ?Pertinent labs & imaging results that were available during my care of the patient were reviewed by me and considered in my medical decision making (see chart for details). ? ?36 year old female presenting for 1 week history of cough and congestion.  Cough is productive of  yellowish sputum over the past 3  days.  Denies any discoloration to nasal drainage and denies sinus pain or pressure.  No fever or breathing difficulty.  Vitals are stable.  Patient mildly ill-appearing but nontoxic.  Nasal congestion on exam, erythema posterior pharynx with postnasal drainage.  Chest clear auscultation heart regular rate and rhythm.  Advised patient symptoms not consistent with bacterial sinusitis.  I suspect she has ongoing viral illness, potentially moving to bronchitis.  Supportive care encouraged with increasing rest and fluids.  Sent Bromfed-DM to pharmacy as well as Atrovent nasal spray.  Advised her she may be sick for another 7 to 10 days but she should return if she develops any worsening of symptoms. ? ? ?Final Clinical Impressions(s) / UC Diagnoses  ? ?Final diagnoses:  ?Viral upper respiratory tract infection  ?Acute cough  ?Nasal congestion  ? ? ? ?Discharge Instructions   ? ?  ?URI/COLD SYMPTOMS: Your exam today is consistent with a viral illness. Antibiotics are not indicated at this time. Use medications as directed, including cough syrup, nasal saline, and decongestants. Your symptoms should improve over the next few days and resolve within another 7-10 days. Increase rest and fluids. F/u if symptoms worsen or predominate such as sore throat, ear pain, productive cough, shortness of breath, or if you develop high fevers or worsening fatigue over the next several days.   ? ?-Use GoodRx for prescriptions. No need to get allergy meds if you use this cough medicine since it has an antihistamine. ? ? ? ? ?ED Prescriptions   ? ? Medication Sig Dispense Auth. Provider  ? brompheniramine-pseudoephedrine-DM 30-2-10 MG/5ML syrup Take 10 mLs by mouth 4 (four) times daily as needed for up to 7 days. 118 mL Eusebio Friendly B, PA-C  ? ipratropium (ATROVENT) 0.06 % nasal spray Place 2 sprays into both nostrils 4 (four) times daily. 15 mL Eusebio Friendly B, PA-C  ? ?  ? ?PDMP not reviewed this encounter. ?  ?Shirlee Latch,  PA-C ?03/18/22 0901 ? ?

## 2022-03-18 NOTE — ED Triage Notes (Signed)
Pt c/o possible sinus infection. Pt has cough with green/yellow phlegm, nasal congestion x1week. ?

## 2023-03-22 NOTE — Progress Notes (Signed)
-------------------------------------------------------------------------------   Attestation signed by Markham Harlene Norris, MD at 03/22/23 1641 I was immediately available via phone/pager or present on site.  I reviewed and discussed the case with the resident, but did not see the patient.  I agree with the assessment and plan as documented in the resident's note. Harlene FORBES Markham, MD  -------------------------------------------------------------------------------  ASSESSMENT/PLAN: Jeanne Pena is a 37 y.o. 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 Reviewed that IUDs are good for 8 years and she can continue with her IUD for that long. Patient has had 3 IUDs and understands the risk and wants to continue with current IUD.   SUBJECTIVE: CC: contraceptive implant removal  HPI: Jeanne Pena is a 37 y.o. female  Today she reports she would like her contraceptive implant removed.  She had a Mirena IUD placed 6 years ago with the Nexplanon in place. The Nexplanon is 36 years old and she would like it removed.   REVIEW OF SYSTEMS : A comprehensive review of 14 systems was negative except for pertinent positives noted in HPI. Objective BP 130/84 mmHg  Pulse 93  Temp(Src) 36.8 C (98.2 F) (Oral)  Ht 165.1 cm (5' 5)  Wt 76.567 kg (168 lb 12.8 oz)  BMI 28.09 kg/m2 Body mass index is 28.09 kg/(m^2). GENERAL: alert  CONTRACEPTIVE IMPLANT REMOVAL PROCEDURE NOTE The patient counselled regarding contraceptive implant removal specifically the potential difficulty in removal, the need to palpate the implant after placement and intermittently over the next three years.   Time out obtained.   The patient's right  arm was palpated and the implant device located.  The area was prepped with betadine. The distal end of the device was palpated and 5 cc  of 1% lidocaine was injected. A 2 mm incision was made. Any fibrotic tissue was carefully dissected away using blunt and/or sharp dissection. The device was removed intact.  A steri-strip was applied to approximate the skin edges and a sterile dressing was applied to the incision.   The patient tolerated the procedure well.

## 2023-12-25 ENCOUNTER — Ambulatory Visit (LOCAL_COMMUNITY_HEALTH_CENTER): Payer: Medicaid Other

## 2023-12-25 DIAGNOSIS — Z111 Encounter for screening for respiratory tuberculosis: Secondary | ICD-10-CM

## 2023-12-28 ENCOUNTER — Ambulatory Visit: Payer: Medicaid Other

## 2023-12-28 DIAGNOSIS — Z111 Encounter for screening for respiratory tuberculosis: Secondary | ICD-10-CM

## 2023-12-28 LAB — TB SKIN TEST
Induration: 0 mm
TB Skin Test: NEGATIVE

## 2024-02-01 DIAGNOSIS — G43019 Migraine without aura, intractable, without status migrainosus: Secondary | ICD-10-CM | POA: Diagnosis not present

## 2024-06-26 ENCOUNTER — Emergency Department

## 2024-06-26 ENCOUNTER — Emergency Department: Admission: EM | Admit: 2024-06-26 | Discharge: 2024-06-26 | Disposition: A | Discharge status: Home / Self Care

## 2024-06-26 ENCOUNTER — Other Ambulatory Visit: Payer: Self-pay

## 2024-06-26 DIAGNOSIS — N83201 Unspecified ovarian cyst, right side: Secondary | ICD-10-CM | POA: Diagnosis not present

## 2024-06-26 DIAGNOSIS — R1031 Right lower quadrant pain: Secondary | ICD-10-CM | POA: Diagnosis not present

## 2024-06-26 DIAGNOSIS — N838 Other noninflammatory disorders of ovary, fallopian tube and broad ligament: Secondary | ICD-10-CM | POA: Diagnosis not present

## 2024-06-26 DIAGNOSIS — Z90721 Acquired absence of ovaries, unilateral: Secondary | ICD-10-CM | POA: Diagnosis not present

## 2024-06-26 LAB — COMPREHENSIVE METABOLIC PANEL WITH GFR
ALT: 14 U/L (ref 0–44)
AST: 19 U/L (ref 15–41)
Albumin: 3.8 g/dL (ref 3.5–5.0)
Alkaline Phosphatase: 73 U/L (ref 38–126)
Anion gap: 8 (ref 5–15)
BUN: 8 mg/dL (ref 6–20)
CO2: 24 mmol/L (ref 22–32)
Calcium: 9 mg/dL (ref 8.9–10.3)
Chloride: 105 mmol/L (ref 98–111)
Creatinine, Ser: 0.65 mg/dL (ref 0.44–1.00)
GFR, Estimated: >60 mL/min (ref >60)
Glucose, Bld: 99 mg/dL (ref 70–99)
Potassium: 4.1 mmol/L (ref 3.5–5.1)
Sodium: 137 mmol/L (ref 135–145)
Total Bilirubin: 1.1 mg/dL (ref 0.0–1.2)
Total Protein: 6.7 g/dL (ref 6.5–8.1)

## 2024-06-26 LAB — CBC
HCT: 38.3 % (ref 36.0–46.0)
Hemoglobin: 13 g/dL (ref 12.0–15.0)
MCH: 30.1 pg (ref 26.0–34.0)
MCHC: 33.9 g/dL (ref 30.0–36.0)
MCV: 88.7 fL (ref 80.0–100.0)
Platelets: 206 K/uL (ref 150–400)
RBC: 4.32 MIL/uL (ref 3.87–5.11)
RDW: 12 % (ref 11.5–15.5)
WBC: 6.8 K/uL (ref 4.0–10.5)
nRBC: 0 % (ref 0.0–0.2)

## 2024-06-26 LAB — URINALYSIS, ROUTINE W REFLEX MICROSCOPIC
Bilirubin Urine: NEGATIVE
Glucose, UA: NEGATIVE mg/dL
Hgb urine dipstick: NEGATIVE
Ketones, ur: NEGATIVE mg/dL
Leukocytes,Ua: NEGATIVE
Nitrite: NEGATIVE
Protein, ur: NEGATIVE mg/dL
Specific Gravity, Urine: 1.005 (ref 1.005–1.030)
pH: 6 (ref 5.0–8.0)

## 2024-06-26 LAB — POC URINE PREG, ED: Preg Test, Ur: NEGATIVE

## 2024-06-26 LAB — LIPASE, BLOOD: Lipase: 32 U/L (ref 11–51)

## 2024-06-26 MED ORDER — KETOROLAC TROMETHAMINE 15 MG/ML IJ SOLN
15.0000 mg | Freq: Once | INTRAMUSCULAR | Status: DC
  Filled 2024-06-26: qty 1

## 2024-06-26 MED ORDER — ONDANSETRON 4 MG PO TBDP
4.0000 mg | ORAL_TABLET | Freq: Once | ORAL | Status: AC
Start: 2024-06-26 — End: 2024-06-26
  Administered 2024-06-26: 4 mg via ORAL
  Filled 2024-06-26: qty 1

## 2024-06-26 MED ORDER — NAPROXEN 500 MG PO TABS: 500.0000 mg | ORAL_TABLET | Freq: Two times a day (BID) | ORAL | 0 refills | Status: AC

## 2024-06-26 MED ORDER — ONDANSETRON HCL 4 MG/2ML IJ SOLN
4.0000 mg | Freq: Once | INTRAMUSCULAR | Status: DC
  Filled 2024-06-26: qty 2

## 2024-06-26 NOTE — ED Provider Notes (Signed)
 [facility] Provider Note    Event Date/Time   First MD Initiated Contact with Patient 06/26/24 1126     (approximate)   History   Abdominal Pain   HPI  CECILA Pena is a 38 y.o. 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  Patient reports that this began this morning.  She reports that is waxing and waning.  She reports she feels nauseated but has not had any vomiting.  No fevers or chills.  No vaginal discharge or bleeding.  No burning with urination.  There are no active problems to display for this patient.         Physical Exam   Triage Vital Signs: ED Triage Vitals  Encounter Vitals Group     BP 06/26/24 1042 114/76     Girls Systolic BP Percentile --      Girls Diastolic BP Percentile --      Boys Systolic BP Percentile --      Boys Diastolic BP Percentile --      Pulse Rate 06/26/24 1042 80     Resp 06/26/24 1042 18     Temp 06/26/24 1042 98.1 F (36.7 C)     Temp Source 06/26/24 1042 Oral     SpO2 06/26/24 1042 99 %     Weight 06/26/24 1041 180 lb (81.6 kg)     Height 06/26/24 1041 5' 2 (1.575 m)     Head Circumference --      Peak Flow --      Pain Score 06/26/24 1038 8     Pain Loc --      Pain Education --      Exclude from Growth Chart --     Most recent vital signs: Vitals:   06/26/24 1042 06/26/24 1134  BP: 114/76   Pulse: 80   Resp: 18   Temp: 98.1 F (36.7 C)   SpO2: 99% 99%    Physical Exam Vitals and nursing note reviewed.  Constitutional:      General: Awake and alert. No acute distress.    Appearance: Normal appearance. The patient is normal weight.  HENT:     Head: Normocephalic and atraumatic.     Mouth: Mucous membranes are moist.  Eyes:     General: PERRL. Normal EOMs        Right eye: No discharge.        Left eye: No discharge.     Conjunctiva/sclera: Conjunctivae  normal.  Cardiovascular:     Rate and Rhythm: Normal rate and regular rhythm.     Pulses: Normal pulses.  Pulmonary:     Effort: Pulmonary effort is normal. No respiratory distress.     Breath sounds: Normal breath sounds.  Abdominal:     Abdomen is soft. There is mild right lower quadrant abdominal tenderness. No rebound or guarding. No distention.  No upper abdominal tenderness.  Negative Rovsing's, obturator, and psoas signs. Musculoskeletal:        General: No swelling. Normal range of motion.     Cervical back: Normal range of motion and neck supple.  Skin:    General: Skin is warm and dry.     Capillary Refill: Capillary refill takes less than 2 seconds.     Findings: No rash.  Neurological:     Mental Status: The patient is awake and alert.  ED Results / Procedures / Treatments   Labs (all labs ordered are listed, but only abnormal results are displayed) Labs Reviewed  URINALYSIS, ROUTINE W REFLEX MICROSCOPIC - Abnormal; Notable for the following components:      Result Value   Color, Urine STRAW (*)    APPearance HAZY (*)    All other components within normal limits  LIPASE, BLOOD  COMPREHENSIVE METABOLIC PANEL WITH GFR  CBC  POC URINE PREG, ED     EKG     RADIOLOGY I independently reviewed and interpreted imaging and agree with radiologists findings.  IMPRESSION: 1. Mildly complex cystic lesion in the right ovary measuring up to 5.0 cm. This is likely a benign cyst. Recommend follow-up ultrasound in 6-12 weeks to assess for resolution. 2. No evidence of right ovarian torsion. 3. IUD in place. 4. Left ovary surgically absent.   PROCEDURES:  Critical Care performed:   Procedures   MEDICATIONS ORDERED IN ED: Medications  ketorolac  (TORADOL ) 15 MG/ML injection 15 mg (15 mg Intramuscular Not Given 06/26/24 1358)  ondansetron  (ZOFRAN -ODT) disintegrating tablet 4 mg (4 mg Oral Given 06/26/24 1400)     IMPRESSION / MDM / ASSESSMENT AND PLAN / ED  COURSE  I reviewed the triage vital signs and the nursing notes.   Differential diagnosis includes, but is not limited to, ovarian cyst, ovarian torsion, appendicitis, UTI/pyelonephritis, TOA.  Patient is awake and alert, hemodynamically stable and afebrile.  She has tenderness in her right lower quadrant.  I reviewed the patient's chart.  I am able to see a previous pelvic ultrasound from 2017 that reveals a complex septated right ovarian cyst without torsion.  In 2013, she had a CT abdomen and pelvis which reveals an abnormal complex appearing left lower abdominal mass and adnexal mass, with subsequent ultrasound.  Patient had surgery on 04/25/2012 which revealed a torsed left ovary.  The patient is resting comfortably on the stretcher.  She does not appear to be in any acute distress.  She does have tenderness in her right lower quadrant.  Further workup is indicated.  Labs obtained are overall reassuring, no leukocytosis.  No rebound or guarding, negative Rovsing's and psoas signs.  I do not suspect appendicitis given her lack of pain with walking, no rebound or guarding, normal white blood cell count, negative Rovsing's, psoas, and obturator signs, and feel that pelvic etiology is more likely.  Therefore pelvic ultrasound obtained.  This reveals a 5 cm right ovarian cyst with normal blood flow.  Cyst is benign appearing.  There is no waxing and waning/intermittent discomfort to suggest intermittent torsion or incomplete torsion.  She requested a dose of Toradol  prior to discharge which was provided.  She was given a prescription for naproxen  to take at home.  We discussed strict return precautions for signs of torsion or appendicitis and these were discussed at length.  We also discussed return precautions for any other new, worsening, or change in symptoms or other concerns.  I recommended close outpatient follow-up with OB/GYN, and also recommended a repeat ultrasound in 6 to 12 weeks as recommended  by radiology.  Patient understands and agrees with plan.  She was discharged in stable condition.   Patient's presentation is most consistent with acute presentation with potential threat to life or bodily function.      FINAL CLINICAL IMPRESSION(S) / ED DIAGNOSES   Final diagnoses:  Cyst of right ovary     Rx / DC Orders   ED Discharge Orders  Ordered    naproxen  (NAPROSYN ) 500 MG tablet  2 times daily with meals        06/26/24 1354             Note:  This document was prepared using Dragon voice recognition software and may include unintentional dictation errors.   Shemeca Lukasik E, PA-C 06/26/24 1424    Clarine Ozell LABOR, MD 06/30/24 0002

## 2024-06-26 NOTE — ED Triage Notes (Signed)
 Pt via [facility] from home. Pt RLQ abd pain that started this AM, reports some nausea for the past 5 days. Pt has a hx of cholecystectomy. Denies any urinary symptoms. Pt is A&OX4 and NAD.

## 2024-06-26 NOTE — Discharge Instructions (Signed)
 Please follow-up with OB/GYN.  You were found to have a cyst on your right ovary.  You may take the medication as prescribed to help with your symptoms.  Please follow-up with your outpatient provider.  Please return for any new, worsening, or change in symptoms or other concerns including worsening pain, vomiting, fevers, or any other changes or concerns.  It was a pleasure caring for you today.

## 2024-06-26 NOTE — ED Triage Notes (Signed)
 First nurse note Pt to ED via [facility] from home. Pt reports left lower abd pain. Hx of ovarian cyst w/ ovary removal. Constipation x5 days.

## 2024-11-21 ENCOUNTER — Encounter: Payer: Self-pay | Admitting: Emergency Medicine

## 2024-11-21 ENCOUNTER — Ambulatory Visit
Admission: EM | Admit: 2024-11-21 | Discharge: 2024-11-21 | Disposition: A | Discharge status: Home / Self Care | Payer: Self-pay | Attending: Family Medicine | Admitting: Family Medicine

## 2024-11-21 DIAGNOSIS — H6991 Unspecified Eustachian tube disorder, right ear: Secondary | ICD-10-CM

## 2024-11-21 MED ORDER — FLUTICASONE PROPIONATE 50 MCG/ACT NA SUSP: 1.0000 | Freq: Every day | NASAL | 0 refills | Status: AC

## 2024-11-21 MED ORDER — PREDNISONE 20 MG PO TABS: 40.0000 mg | ORAL_TABLET | Freq: Every day | ORAL | 0 refills | Status: AC

## 2024-11-21 NOTE — Discharge Instructions (Addendum)
 Start Flonase  nasal spray once a day.  Start prednisone daily as well.  You can use over-the-counter allergy meds such as Claritin or Zyrtec daily as well.  Follow-up with your PCP if your symptoms do not improve.  Please go to the ER for any worsening symptoms.  Hope you feel better soon!

## 2024-11-21 NOTE — ED Triage Notes (Signed)
 Pt c/o right ear pain, fullness, sinus pressure. Started about 2 days ago. She states she has a perforated ear drum in that ear in the past.

## 2024-11-21 NOTE — ED Provider Notes (Signed)
 " [facility]    CSN: [id number] Arrival date & time: 11/21/24  1527      History   Chief Complaint Chief Complaint  Patient presents with   Otalgia    right    HPI Jeanne Pena is a 39 y.o. female  presents for evaluation of URI symptoms for 2 days. Patient reports associated symptoms of sinus congestion and right ear fullness/diminished hearing. Denies N/V/D, cough, sore throat, fevers, ear pain, body aches or shortness of breath. Patient does not have a hx of asthma. Patient is not an active smoker.    Pt has taken nothing OTC for symptoms.  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  Pt has no other concerns at this time.    Otalgia   Past Medical History:  Diagnosis Date   Migraine     There are no active problems to display for this patient.   Past Surgical History:  Procedure Laterality Date   ABDOMINAL SURGERY     CESAREAN SECTION     CHOLECYSTECTOMY     LAPAROSCOPIC OOPHERECTOMY     l    OB History     Gravida  4   Para  0   Term  0   Preterm  0   AB  2   Living         SAB  2   IAB  0   Ectopic  0   Multiple      Live Births               Home Medications    Prior to Admission medications  Medication Sig Start Date End Date Taking? Authorizing Provider  amitriptyline (ELAVIL) 10 MG tablet amitriptyline 10 mg tablet 11/18/19   [provider]  fluticasone  (FLONASE ) 50 MCG/ACT nasal spray Place 1 spray into both nostrils daily. 11/21/24  Yes Naquita Nappier, Jodi R, NP  ibuprofen  (ADVIL ) 600 MG tablet Take 1 tablet (600 mg total) by mouth every 6 (six) hours as needed. 01/22/20   Alona Knee, PA-C  ipratropium (ATROVENT ) 0.06 % nasal spray Place 2 sprays into both nostrils 4 (four) times daily. 03/18/22   Arvis Jolan NOVAK, PA-C  levonorgestrel (MIRENA) 20 MCG/DAY IUD by Intrauterine route.    [provider]  predniSONE (DELTASONE) 20 MG tablet Take 2 tablets (40 mg total) by mouth daily with  breakfast for 5 days. 11/21/24 11/26/24 Yes [place] Sorn, Jodi R, NP  rizatriptan (MAXALT) 10 MG tablet Take by mouth.    [provider]  SUMAtriptan (IMITREX) 25 MG tablet Take 25 mg by mouth every 2 (two) hours as needed for migraine. May repeat in 2 hours if headache persists or recurs.    [provider]  tiZANidine (ZANAFLEX) 4 MG tablet tizanidine 4 mg tablet 01/24/22   [provider]    Family History History reviewed. No pertinent family history.  Social History Social History[1]   Allergies   Patient has no known allergies.   Review of Systems Review of Systems  HENT:         Right ear fullness     Physical Exam Triage Vital Signs ED Triage Vitals  Encounter Vitals Group     BP 11/21/24 1655 136/81     Girls Systolic BP Percentile --      Girls Diastolic BP Percentile --      Boys Systolic BP Percentile --      Boys Diastolic BP Percentile --  Pulse Rate 11/21/24 1655 73     Resp 11/21/24 1655 16     Temp 11/21/24 1655 98.6 F (37 C)     Temp Source 11/21/24 1655 Oral     SpO2 11/21/24 1655 98 %     Weight 11/21/24 1654 179 lb 14.3 oz (81.6 kg)     Height 11/21/24 1654 5' 2 (1.575 m)     Head Circumference --      Peak Flow --      Pain Score 11/21/24 1654 7     Pain Loc --      Pain Education --      Exclude from Growth Chart --    No data found.  Updated Vital Signs BP 136/81 (BP Location: Right Arm)   Pulse 73   Temp 98.6 F (37 C) (Oral)   Resp 16   Ht 5' 2 (1.575 m)   Wt 179 lb 14.3 oz (81.6 kg)   SpO2 98%   BMI 32.90 kg/m   Visual Acuity Right Eye Distance:   Left Eye Distance:   Bilateral Distance:    Right Eye Near:   Left Eye Near:    Bilateral Near:     Physical Exam Vitals and nursing note reviewed.  Constitutional:      General: She is not in acute distress.    Appearance: Normal appearance. She is well-developed. She is not ill-appearing.  HENT:     Head: Normocephalic and atraumatic.     Right  Ear: Ear canal normal. A middle ear effusion is present. Tympanic membrane is not perforated or erythematous.     Left Ear: Tympanic membrane and ear canal normal.     Nose: Congestion present.     Mouth/Throat:     Mouth: Mucous membranes are moist.     Pharynx: Oropharynx is clear. Uvula midline. No posterior oropharyngeal erythema.     Tonsils: No tonsillar exudate or tonsillar abscesses.  Eyes:     Conjunctiva/sclera: Conjunctivae normal.     Pupils: Pupils are equal, round, and reactive to light.  Cardiovascular:     Rate and Rhythm: Normal rate and regular rhythm.     Heart sounds: Normal heart sounds.  Pulmonary:     Effort: Pulmonary effort is normal.     Breath sounds: Normal breath sounds.  Musculoskeletal:     Cervical back: Normal range of motion and neck supple.  Lymphadenopathy:     Cervical: No cervical adenopathy.  Skin:    General: Skin is warm and dry.  Neurological:     General: No focal deficit present.     Mental Status: She is alert and oriented to person, place, and time.  Psychiatric:        Mood and Affect: Mood normal.        Behavior: Behavior normal.      UC Treatments / Results  Labs (all labs ordered are listed, but only abnormal results are displayed) Labs Reviewed - No data to display  EKG   Radiology No results found.  Procedures Procedures (including critical care time)  Medications Ordered in UC Medications - No data to display  Initial Impression / Assessment and Plan / UC Course  I have reviewed the triage vital signs and the nursing notes.  Pertinent labs & imaging results that were available during my care of the patient were reviewed by me and considered in my medical decision making (see chart for details).     Reviewed exam and  symptoms with patient.  Discussed eustachian tube dysfunction.  Will do trial of Flonase  and prednisone.  She can also do over-the-counter Claritin or Zyrtec daily.  PCP follow-up if symptoms do  not.  ER precautions reviewed Final Clinical Impressions(s) / UC Diagnoses   Final diagnoses:  Dysfunction of right eustachian tube     Discharge Instructions      Start Flonase  nasal spray once a day.  Start prednisone daily as well.  You can use over-the-counter allergy meds such as Claritin or Zyrtec daily as well.  Follow-up with your PCP if your symptoms do not improve.  Please go to the ER for any worsening symptoms.  Hope you feel better soon!    ED Prescriptions     Medication Sig Dispense Auth. Provider   fluticasone  (FLONASE ) 50 MCG/ACT nasal spray Place 1 spray into both nostrils daily. 15.8 mL Macarius Ruark, Jodi R, NP   predniSONE (DELTASONE) 20 MG tablet Take 2 tablets (40 mg total) by mouth daily with breakfast for 5 days. 10 tablet Yariel Ferraris, Jodi R, NP      PDMP not reviewed this encounter.    [1]  Social History Tobacco Use   Smoking status: Never   Smokeless tobacco: Never  Vaping Use   Vaping status: Never Used  Substance Use Topics   Alcohol use: Yes    Comment: socially   Drug use: Yes    Types: Marijuana     Loreda Myla SAUNDERS, NP 11/21/24 1713  "
